# Patient Record
Sex: Male | Born: 1955 | Race: White | Hispanic: No | Marital: Married | Smoking: Former smoker
Health system: Southern US, Community
[De-identification: ages and names within clinical notes are randomized; demographics above are authoritative.]

## PROBLEM LIST (undated history)

## (undated) DIAGNOSIS — T7840XA Allergy, unspecified, initial encounter: Secondary | ICD-10-CM

## (undated) DIAGNOSIS — I10 Essential (primary) hypertension: Secondary | ICD-10-CM

## (undated) DIAGNOSIS — M109 Gout, unspecified: Secondary | ICD-10-CM

## (undated) DIAGNOSIS — E079 Disorder of thyroid, unspecified: Secondary | ICD-10-CM

## (undated) HISTORY — DX: Allergy, unspecified, initial encounter: T78.40XA

## (undated) HISTORY — DX: Essential (primary) hypertension: I10

## (undated) HISTORY — DX: Gout, unspecified: M10.9

## (undated) HISTORY — DX: Disorder of thyroid, unspecified: E07.9

## (undated) HISTORY — PX: KNEE ARTHROSCOPY: SHX127

---

## 1998-11-09 HISTORY — PX: TOTAL HIP ARTHROPLASTY: SHX124

## 2002-11-09 HISTORY — PX: TOTAL HIP ARTHROPLASTY: SHX124

## 2012-11-28 DIAGNOSIS — E559 Vitamin D deficiency, unspecified: Secondary | ICD-10-CM | POA: Insufficient documentation

## 2012-11-28 LAB — LIPID PANEL
Cholesterol: 164 mg/dL (ref 0–200)
HDL: 36 mg/dL (ref 35–70)
LDL Cholesterol: 86 mg/dL
Triglycerides: 207 mg/dL — AB (ref 40–160)

## 2012-11-28 LAB — HEPATIC FUNCTION PANEL
ALT: 43 U/L — AB (ref 10–40)
AST: 25 U/L (ref 14–40)
Alkaline Phosphatase: 39 U/L (ref 25–125)
Bilirubin, Total: 1.5 mg/dL

## 2012-11-28 LAB — CBC AND DIFFERENTIAL
HCT: 46 % (ref 41–53)
Hemoglobin: 16.3 g/dL (ref 13.5–17.5)
Platelets: 273 10*3/uL (ref 150–399)
WBC: 7.9 10^3/mL

## 2012-11-28 LAB — BASIC METABOLIC PANEL
BUN: 19 mg/dL (ref 4–21)
Creatinine: 1.1 mg/dL (ref 0.6–1.3)
Glucose: 122 mg/dL
Potassium: 5 mmol/L (ref 3.4–5.3)
Sodium: 145 mmol/L (ref 137–147)

## 2012-11-28 LAB — HEMOGLOBIN A1C: Hgb A1c MFr Bld: 5.5 % (ref 4.0–6.0)

## 2013-05-26 ENCOUNTER — Ambulatory Visit (INDEPENDENT_AMBULATORY_CARE_PROVIDER_SITE_OTHER): Payer: 59 | Admitting: Family Medicine

## 2013-05-26 ENCOUNTER — Encounter: Payer: Self-pay | Admitting: Family Medicine

## 2013-05-26 VITALS — BP 132/82 | HR 51 | Ht 65.0 in | Wt 175.0 lb

## 2013-05-26 DIAGNOSIS — N200 Calculus of kidney: Secondary | ICD-10-CM

## 2013-05-26 DIAGNOSIS — M1120 Other chondrocalcinosis, unspecified site: Secondary | ICD-10-CM | POA: Insufficient documentation

## 2013-05-26 DIAGNOSIS — M109 Gout, unspecified: Secondary | ICD-10-CM

## 2013-05-26 DIAGNOSIS — H409 Unspecified glaucoma: Secondary | ICD-10-CM

## 2013-05-26 DIAGNOSIS — I10 Essential (primary) hypertension: Secondary | ICD-10-CM

## 2013-05-26 DIAGNOSIS — E559 Vitamin D deficiency, unspecified: Secondary | ICD-10-CM

## 2013-05-26 LAB — LIPID PANEL
Cholesterol: 153 mg/dL (ref 0–200)
HDL: 37 mg/dL — ABNORMAL LOW (ref >39)
LDL Cholesterol: 80 mg/dL (ref 0–99)
Total CHOL/HDL Ratio: 4.1 Ratio
Triglycerides: 179 mg/dL — ABNORMAL HIGH (ref <150)
VLDL: 36 mg/dL (ref 0–40)

## 2013-05-26 LAB — COMPLETE METABOLIC PANEL WITH GFR
ALT: 26 U/L (ref 0–53)
AST: 22 U/L (ref 0–37)
Albumin: 4.7 g/dL (ref 3.5–5.2)
Alkaline Phosphatase: 34 U/L — ABNORMAL LOW (ref 39–117)
BUN: 18 mg/dL (ref 6–23)
CO2: 28 mEq/L (ref 19–32)
Calcium: 10.1 mg/dL (ref 8.4–10.5)
Chloride: 103 mEq/L (ref 96–112)
Creat: 1.2 mg/dL (ref 0.50–1.35)
GFR, Est African American: 77 mL/min
GFR, Est Non African American: 66 mL/min
Glucose, Bld: 124 mg/dL — ABNORMAL HIGH (ref 70–99)
Potassium: 5.3 mEq/L (ref 3.5–5.3)
Sodium: 139 mEq/L (ref 135–145)
Total Bilirubin: 1.6 mg/dL — ABNORMAL HIGH (ref 0.3–1.2)
Total Protein: 7.3 g/dL (ref 6.0–8.3)

## 2013-05-26 LAB — URIC ACID: Uric Acid, Serum: 8.5 mg/dL — ABNORMAL HIGH (ref 4.0–7.8)

## 2013-05-26 MED ORDER — ALLOPURINOL 100 MG PO TABS: 100.0000 mg | ORAL_TABLET | Freq: Every day | ORAL | Status: DC

## 2013-05-26 MED ORDER — SPIRONOLACTONE 25 MG PO TABS: 25.0000 mg | ORAL_TABLET | Freq: Every day | ORAL | Status: DC

## 2013-05-26 MED ORDER — COLCHICINE 0.6 MG PO TABS: 0.6000 mg | ORAL_TABLET | ORAL | Status: DC | PRN

## 2013-05-26 MED ORDER — AMLODIPINE BESYLATE 5 MG PO TABS: 5.0000 mg | ORAL_TABLET | Freq: Every day | ORAL | Status: DC

## 2013-05-26 MED ORDER — ATENOLOL 100 MG PO TABS: 100.0000 mg | ORAL_TABLET | Freq: Every day | ORAL | Status: DC

## 2013-05-26 NOTE — Progress Notes (Signed)
Subjective:    Patient ID: Christopher Gregory, male    DOB: Apr 17, 1956, 57 y.o.   MRN: [id number]  HPI HTN -  Pt denies chest pain, SOB, dizziness, or heart palpitations.  Taking meds as directed w/o problems.  Denies medication side effects. 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    Gout - Has about 2 gout flares a year. Last flare was last weekend. He is not on allopurinol.   Vit D def - now taking 2000IU.  Last level that was drawn was 11/28/2012. Vitamin D. level was 20.  Hyperlipidemia - inc his red yeast rice to BID.    Review of Systems  Constitutional: Negative for fever, diaphoresis and unexpected weight change.  HENT: Negative for hearing loss, rhinorrhea, sneezing and tinnitus.   Eyes: Negative for visual disturbance.  Respiratory: Negative for cough and wheezing.   Cardiovascular: Negative for chest pain and palpitations.  Gastrointestinal: Negative for nausea, vomiting, diarrhea and blood in stool.  Genitourinary: Negative for dysuria and discharge.  Musculoskeletal: Negative for myalgias and arthralgias.  Skin: Negative for rash.  Neurological: Negative for headaches.  Hematological: Negative for adenopathy.  Psychiatric/Behavioral: Negative for sleep disturbance and dysphoric mood. The patient is not nervous/anxious.    BP 132/82  Pulse 51  Ht 5\' 5"  (1.651 m)  Wt 175 lb (79.379 kg)  BMI 29.12 kg/m2    Allergies  Allergen Reactions  . Ace Inhibitors Other (See Comments)    Allergic reaction  . Erythromycin Rash    Past Medical History  Diagnosis Date  . Hypertension   . Gout     Past Surgical History  Procedure Laterality Date  . Total hip arthroplasty  2000  . Total hip arthroplasty  2004  . Knee arthroscopy      History   Social History  . Marital Status: Married    Spouse Name: Dois Davenport    Number of Children: N/A  . Years of Education: N/A   Occupational History  . 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     Social History Main Topics  . Smoking status:  Former Smoker    Types: Cigarettes    Quit date: 11/26/1973  . Smokeless tobacco: Never Used  . Alcohol Use: No  . Drug Use: No  . Sexually Active: Yes -- Male partner(s)   Other Topics Concern  . Not on file   Social History Narrative   No regular exercise.     Family History  Problem Relation Age of Onset  . Hypertension Mother   . Hyperlipidemia Father     Outpatient Encounter Prescriptions as of 05/26/2013  Medication Sig Dispense Refill  . amLODipine (NORVASC) 5 MG tablet Take 1 tablet (5 mg total) by mouth daily.  90 tablet  2  . atenolol (TENORMIN) 100 MG tablet Take 1 tablet (100 mg total) by mouth daily.  90 tablet  2  . Coenzyme Q10-Red Yeast Rice (CO Q-10 PLUS RED YEAST RICE PO) Take 2 capsules by mouth daily.      . colchicine (COLCRYS) 0.6 MG tablet Take 1 tablet (0.6 mg total) by mouth as needed.  90 tablet  2  . latanoprost (XALATAN) 0.005 % ophthalmic solution 1 drop at bedtime.      Marland Kitchen spironolactone (ALDACTONE) 25 MG tablet Take 1 tablet (25 mg total) by mouth daily.  90 tablet  2  . [DISCONTINUED] amLODipine (NORVASC) 5 MG tablet Take 5 mg by mouth daily.      . [  DISCONTINUED] atenolol (TENORMIN) 100 MG tablet Take 100 mg by mouth daily.      . [DISCONTINUED] colchicine (COLCRYS) 0.6 MG tablet Take 0.6 mg by mouth as needed.      . [DISCONTINUED] spironolactone (ALDACTONE) 25 MG tablet Take 25 mg by mouth daily.      Marland Kitchen allopurinol (ZYLOPRIM) 100 MG tablet Take 1 tablet (100 mg total) by mouth daily.  90 tablet  2   No facility-administered encounter medications on file as of 05/26/2013.          Objective:   Physical Exam  Constitutional: He is oriented to person, place, and time. He appears well-developed and well-nourished.  HENT:  Head: Normocephalic and atraumatic.  Cardiovascular: Normal rate, regular rhythm and normal heart sounds.   Pulmonary/Chest: Effort normal and breath sounds normal.  Musculoskeletal: He exhibits no edema.  Neurological:  He is alert and oriented to person, place, and time.  Skin: Skin is warm and dry.  Psychiatric: He has a normal mood and affect. His behavior is normal.          Assessment & Plan:  HTN - Well controlled. Check CMP because he has a normal outcome. Reminded him that he does need to be seen twice a year for his blood pressure does need routine blood work because of his medications.  F/U in 6 months.   Gout - had a long discussion about tx.  Discussed need to start allpurinol. He is really not on anything to modify his disease process.  Him that even though he only has 2 flares per year that the gout is still causing destruction to his joints slowly over time. We discussed the risks and benefits of the medication. We'll start with 1 100 mg. We'll recheck his uric acid in 6 months. Goal is to get uric acid under 6. We can adjust his dose at that time.  Vitamin D deficiency-he is now increased his vitamin D 2 2000 international units daily. Recheck level today.  Hyperlipidemia-he has increased his red yeast rice to 2 tabs daily. He is very adjusted to see what his repeat lipids show.

## 2013-05-27 LAB — VITAMIN D 25 HYDROXY (VIT D DEFICIENCY, FRACTURES): Vit D, 25-Hydroxy: 48 ng/mL (ref 30–89)

## 2013-06-06 ENCOUNTER — Encounter: Payer: Self-pay | Admitting: *Deleted

## 2013-10-28 ENCOUNTER — Encounter: Payer: Self-pay | Admitting: Emergency Medicine

## 2013-10-28 ENCOUNTER — Emergency Department
Admission: EM | Admit: 2013-10-28 | Discharge: 2013-10-28 | Disposition: A | Discharge status: Home / Self Care | Payer: 59 | Source: Home / Self Care | Attending: Family Medicine | Admitting: Family Medicine

## 2013-10-28 DIAGNOSIS — J069 Acute upper respiratory infection, unspecified: Secondary | ICD-10-CM

## 2013-10-28 MED ORDER — AMOXICILLIN 875 MG PO TABS: 875.0000 mg | ORAL_TABLET | Freq: Two times a day (BID) | ORAL | Status: DC

## 2013-10-28 MED ORDER — BENZONATATE 200 MG PO CAPS: 200.0000 mg | ORAL_CAPSULE | Freq: Every day | ORAL | Status: DC

## 2013-10-28 NOTE — ED Provider Notes (Signed)
CSN: [id number]     Arrival date & time 10/28/13  1032 History   First MD Initiated Contact with Patient 10/28/13 1230     Chief Complaint  Patient presents with  . Cough    x 5 days  . Fever    couple of days      HPI Comments: Patient developed a low grade fever to 100.4 about five days ago with nasal congestion, myalgias, chills, and fatigue.  He has had a mild non-productive cough for 4 days.  The history is provided by the patient.    Past Medical History  Diagnosis Date  . Hypertension   . Gout    Past Surgical History  Procedure Laterality Date  . Total hip arthroplasty  2000  . Total hip arthroplasty  2004  . Knee arthroscopy     Family History  Problem Relation Age of Onset  . Hypertension Mother   . Hyperlipidemia Father    History  Substance Use Topics  . Smoking status: Former Smoker    Types: Cigarettes    Quit date: 11/26/1973  . Smokeless tobacco: Never Used  . Alcohol Use: No    Review of Systems No sore throat + cough No pleuritic pain but chest feels tight No wheezing + nasal congestion + post-nasal drainage No sinus pain/pressure No itchy/red eyes No earache No hemoptysis No SOB No fever, + chills No nausea No vomiting No abdominal pain No diarrhea No urinary symptoms No skin rash + fatigue + myalgias No headache Used OTC meds without relief  Allergies  Ace inhibitors and Erythromycin  Home Medications   Current Outpatient Rx  Name  Route  Sig  Dispense  Refill  . allopurinol (ZYLOPRIM) 100 MG tablet   Oral   Take 1 tablet (100 mg total) by mouth daily.   90 tablet   2   . amLODipine (NORVASC) 5 MG tablet   Oral   Take 1 tablet (5 mg total) by mouth daily.   90 tablet   2   . atenolol (TENORMIN) 100 MG tablet   Oral   Take 1 tablet (100 mg total) by mouth daily.   90 tablet   2   . colchicine (COLCRYS) 0.6 MG tablet   Oral   Take 1 tablet (0.6 mg total) by mouth as needed.   90 tablet   2   . latanoprost  (XALATAN) 0.005 % ophthalmic solution      1 drop at bedtime.         Marland Kitchen spironolactone (ALDACTONE) 25 MG tablet   Oral   Take 1 tablet (25 mg total) by mouth daily.   90 tablet   2   . timolol (BETIMOL) 0.25 % ophthalmic solution      1-2 drops 2 (two) times daily.         Marland Kitchen amoxicillin (AMOXIL) 875 MG tablet   Oral   Take 1 tablet (875 mg total) by mouth 2 (two) times daily. (Rx void after 11/05/13)   20 tablet   0   . benzonatate (TESSALON) 200 MG capsule   Oral   Take 1 capsule (200 mg total) by mouth at bedtime. Take as needed for cough   12 capsule   0   . Coenzyme Q10-Red Yeast Rice (CO Q-10 PLUS RED YEAST RICE PO)   Oral   Take 2 capsules by mouth daily.          BP 135/88  Pulse 71  Temp(Src) 99.8  F (37.7 C) (Oral)  Ht 5\' 5"  (1.651 m)  Wt 179 lb (81.194 kg)  BMI 29.79 kg/m2  SpO2 97% Physical Exam Nursing notes and Vital Signs reviewed. Appearance:  Patient appears healthy, stated age, and in no acute distress Eyes:  Pupils are equal, round, and reactive to light and accomodation.  Extraocular movement is intact.  Conjunctivae are not inflamed  Ears:  Canals normal.  Tympanic membranes normal.  Nose:  Mildly congested turbinates.  No sinus tenderness.    Pharynx:  Normal Neck:  Supple.  No adenopathy Lungs:  Clear to auscultation.  Breath sounds are equal.  Heart:  Regular rate and rhythm without murmurs, rubs, or gallops.  Abdomen:  Nontender without masses or hepatosplenomegaly.  Bowel sounds are present.  No CVA or flank tenderness.  Extremities:  No edema.  No calf tenderness Skin:  No rash present.   ED Course  Procedures  none      MDM   1. Acute upper respiratory infections of unspecified site; suspect viral URI    There is no evidence of bacterial infection today.  Treat symptomatically for now  Prescription written for Benzonatate (Tessalon) to take at bedtime for night-time cough.  Take plain Mucinex (1200 mg guaifenesin) twice  daily for cough and congestion.  Increase fluid intake, rest. May use Afrin nasal spray (or generic oxymetazoline) twice daily for about 5 days.  Also recommend using saline nasal spray several times daily and saline nasal irrigation (AYR is a common brand) Stop all antihistamines for now, and other non-prescription cough/cold preparations. Begin Amoxicillin if not improving about 5 days or if persistent fever develops (Given a prescription to hold, with an expiration date)  Follow-up with family doctor if not improving about10 days.     Lattie Haw, MD 10/30/13 [phone]

## 2013-10-28 NOTE — ED Notes (Signed)
Christopher Gregory complains of productive cough with green sputum for 5 days. He also reports fever, chills, runny nose with clear discharge and chest tightness for a couple of days.

## 2013-11-27 ENCOUNTER — Ambulatory Visit (INDEPENDENT_AMBULATORY_CARE_PROVIDER_SITE_OTHER): Payer: 59 | Admitting: Family Medicine

## 2013-11-27 ENCOUNTER — Encounter: Payer: Self-pay | Admitting: Family Medicine

## 2013-11-27 VITALS — BP 128/82 | HR 55 | Temp 98.0°F | Ht 65.0 in | Wt 176.0 lb

## 2013-11-27 DIAGNOSIS — Z125 Encounter for screening for malignant neoplasm of prostate: Secondary | ICD-10-CM

## 2013-11-27 DIAGNOSIS — R7309 Other abnormal glucose: Secondary | ICD-10-CM

## 2013-11-27 DIAGNOSIS — R7301 Impaired fasting glucose: Secondary | ICD-10-CM

## 2013-11-27 DIAGNOSIS — Z0000 Encounter for general adult medical examination without abnormal findings: Secondary | ICD-10-CM

## 2013-11-27 DIAGNOSIS — M109 Gout, unspecified: Secondary | ICD-10-CM

## 2013-11-27 LAB — BASIC METABOLIC PANEL WITH GFR
BUN: 16 mg/dL (ref 6–23)
CO2: 28 mEq/L (ref 19–32)
Calcium: 9.3 mg/dL (ref 8.4–10.5)
Chloride: 103 mEq/L (ref 96–112)
Creat: 1.11 mg/dL (ref 0.50–1.35)
GFR, Est African American: 84 mL/min
GFR, Est Non African American: 72 mL/min
Glucose, Bld: 122 mg/dL — ABNORMAL HIGH (ref 70–99)
Potassium: 4.8 mEq/L (ref 3.5–5.3)
Sodium: 139 mEq/L (ref 135–145)

## 2013-11-27 LAB — URIC ACID: Uric Acid, Serum: 6 mg/dL (ref 4.0–7.8)

## 2013-11-27 LAB — HEMOGLOBIN A1C
Hgb A1c MFr Bld: 5.7 % — ABNORMAL HIGH (ref <5.7)
Mean Plasma Glucose: 117 mg/dL — ABNORMAL HIGH (ref <117)

## 2013-11-27 MED ORDER — ATENOLOL 100 MG PO TABS: 100.0000 mg | ORAL_TABLET | Freq: Every day | ORAL | Status: DC

## 2013-11-27 MED ORDER — AMLODIPINE BESYLATE 5 MG PO TABS: 5.0000 mg | ORAL_TABLET | Freq: Every day | ORAL | Status: DC

## 2013-11-27 MED ORDER — ALLOPURINOL 100 MG PO TABS: 100.0000 mg | ORAL_TABLET | Freq: Every day | ORAL | Status: DC

## 2013-11-27 MED ORDER — COLCHICINE 0.6 MG PO TABS: 0.6000 mg | ORAL_TABLET | ORAL | Status: DC | PRN

## 2013-11-27 MED ORDER — SPIRONOLACTONE 25 MG PO TABS: 25.0000 mg | ORAL_TABLET | Freq: Every day | ORAL | Status: DC

## 2013-11-27 NOTE — Progress Notes (Signed)
Subjective:    Patient ID: Christopher Gregory, male    DOB: [birth year], 58 y.o.   MRN: [id number]  HPI CPE -  Declines flu vaccine. Declines colonoscopy. Thinks his tetanus was 5 years ago. He was previously running 5 miles per day. But backed off because of pain with groin hernia. He does not want to have them surgically repaired right now. He says most days they don't bother him at all and less he significantly increases his activity level.  Hypertension- Pt denies chest pain, SOB, dizziness, or heart palpitations.  Taking meds as directed w/o problems.  Denies medication side effects.    Gout- on allopurinol. Last uric acid level was elevated.  He actually says his hands have felt much better since being on the allopurinol. He did complete 6 months of colcris and allopurinol and now is just taking allopurinol. He has not had any problems or side effects with the medication. He did have some concerns about whether not it is hard on his liver or not.  Abnormal glucose-he did have elevated glucose on his blood work back in August. We're following up on an as well.  Review of Systems Comprehensive review of systems is negative.  BP 143/84  Pulse 55  Temp(Src) 98 F (36.7 C)  Ht 5' 5"  (1.651 m)  Wt 176 lb (79.833 kg)  BMI 29.29 kg/m2  SpO2 97%    Allergies  Allergen Reactions  . Ace Inhibitors Other (See Comments)    Allergic reaction  . Erythromycin Rash    Past Medical History  Diagnosis Date  . Hypertension   . Gout     Past Surgical History  Procedure Laterality Date  . Total hip arthroplasty  2000  . Total hip arthroplasty  2004  . Knee arthroscopy      History   Social History  . Marital Status: Married    Spouse Name: Katharine Look    Number of Children: N/A  . Years of Education: N/A   Occupational History  . 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, Truck driver  .     Social History Main Topics  . Smoking status: Former Smoker    Types: Cigarettes    Quit date: 11/26/1973  .  Smokeless tobacco: Never Used  . Alcohol Use: No  . Drug Use: No  . Sexual Activity: Yes    Partners: Female   Other Topics Concern  . Not on file   Social History Narrative   No regular exercise.     Family History  Problem Relation Age of Onset  . Hypertension Mother   . Hyperlipidemia Father     Outpatient Encounter Prescriptions as of 11/27/2013  Medication Sig  . allopurinol (ZYLOPRIM) 100 MG tablet Take 1 tablet (100 mg total) by mouth daily.  Marland Kitchen amLODipine (NORVASC) 5 MG tablet Take 1 tablet (5 mg total) by mouth daily.  Marland Kitchen atenolol (TENORMIN) 100 MG tablet Take 1 tablet (100 mg total) by mouth daily.  . Coenzyme Q10-Red Yeast Rice (CO Q-10 PLUS RED YEAST RICE PO) Take 2 capsules by mouth daily.  . colchicine (COLCRYS) 0.6 MG tablet Take 1 tablet (0.6 mg total) by mouth as needed.  . latanoprost (XALATAN) 0.005 % ophthalmic solution 1 drop at bedtime.  Marland Kitchen spironolactone (ALDACTONE) 25 MG tablet Take 1 tablet (25 mg total) by mouth daily.  . timolol (BETIMOL) 0.25 % ophthalmic solution 1-2 drops 2 (two) times daily.  . [DISCONTINUED] allopurinol (ZYLOPRIM) 100 MG tablet Take 1  tablet (100 mg total) by mouth daily.  . [DISCONTINUED] amLODipine (NORVASC) 5 MG tablet Take 1 tablet (5 mg total) by mouth daily.  . [DISCONTINUED] atenolol (TENORMIN) 100 MG tablet Take 1 tablet (100 mg total) by mouth daily.  . [DISCONTINUED] colchicine (COLCRYS) 0.6 MG tablet Take 1 tablet (0.6 mg total) by mouth as needed.  . [DISCONTINUED] spironolactone (ALDACTONE) 25 MG tablet Take 1 tablet (25 mg total) by mouth daily.  . [DISCONTINUED] amoxicillin (AMOXIL) 875 MG tablet Take 1 tablet (875 mg total) by mouth 2 (two) times daily. (Rx void after 11/05/13)  . [DISCONTINUED] benzonatate (TESSALON) 200 MG capsule Take 1 capsule (200 mg total) by mouth at bedtime. Take as needed for cough          Objective:   Physical Exam  Constitutional: He is oriented to person, place, and time. He appears  well-developed and well-nourished.  HENT:  Head: Normocephalic and atraumatic.  Right Ear: External ear normal.  Left Ear: External ear normal.  Nose: Nose normal.  Mouth/Throat: Oropharynx is clear and moist.  Eyes: Conjunctivae and EOM are normal. Pupils are equal, round, and reactive to light.  Neck: Normal range of motion. Neck supple. No thyromegaly present.  Cardiovascular: Normal rate, regular rhythm, normal heart sounds and intact distal pulses.   Pulmonary/Chest: Effort normal and breath sounds normal.  Abdominal: Soft. Bowel sounds are normal. He exhibits no distension and no mass. There is no tenderness. There is no rebound and no guarding.  Musculoskeletal: Normal range of motion.  Lymphadenopathy:    He has no cervical adenopathy.  Neurological: He is alert and oriented to person, place, and time. He has normal reflexes.  Skin: Skin is warm and dry.  Psychiatric: He has a normal mood and affect. His behavior is normal. Judgment and thought content normal.          Assessment & Plan:   CPE - Keep up a regular exercise program and make sure you are eating a healthy diet Try to eat 4 servings of dairy a day, or if you are lactose intolerant take a calcium with vitamin D daily.  Your vaccines are up to date.  Declined colonoscopy. He says he has a friend at church use: Was perforated with the procedure and does not want to have it done. He is willing to do stool cards. Said given today. He can drop him off at his convenience.  HTN-   Gout - Due for repeat uric acid.    Abnormal glucose-we'll repeat hemoglobin A1c today.

## 2013-11-28 LAB — PSA: PSA: 1.41 ng/mL (ref <=4.00)

## 2013-12-05 ENCOUNTER — Other Ambulatory Visit: Payer: Self-pay | Admitting: *Deleted

## 2013-12-05 DIAGNOSIS — Z1211 Encounter for screening for malignant neoplasm of colon: Secondary | ICD-10-CM

## 2013-12-05 LAB — POC HEMOCCULT BLD/STL (HOME/3-CARD/SCREEN)
Card #2 Fecal Occult Blod, POC: NEGATIVE
Card #3 Fecal Occult Blood, POC: NEGATIVE
Fecal Occult Blood, POC: NEGATIVE

## 2014-04-26 ENCOUNTER — Ambulatory Visit (INDEPENDENT_AMBULATORY_CARE_PROVIDER_SITE_OTHER): Payer: 59 | Admitting: Sports Medicine

## 2014-04-26 ENCOUNTER — Encounter: Payer: Self-pay | Admitting: Sports Medicine

## 2014-04-26 ENCOUNTER — Ambulatory Visit (INDEPENDENT_AMBULATORY_CARE_PROVIDER_SITE_OTHER): Payer: 59

## 2014-04-26 ENCOUNTER — Telehealth: Payer: Self-pay | Admitting: *Deleted

## 2014-04-26 VITALS — BP 134/84 | HR 53 | Ht 65.0 in | Wt 175.0 lb

## 2014-04-26 DIAGNOSIS — M5416 Radiculopathy, lumbar region: Secondary | ICD-10-CM | POA: Insufficient documentation

## 2014-04-26 DIAGNOSIS — IMO0002 Reserved for concepts with insufficient information to code with codable children: Secondary | ICD-10-CM

## 2014-04-26 DIAGNOSIS — M5137 Other intervertebral disc degeneration, lumbosacral region: Secondary | ICD-10-CM

## 2014-04-26 MED ORDER — PREDNISONE 50 MG PO TABS: ORAL_TABLET | ORAL | Status: DC

## 2014-04-26 MED ORDER — MELOXICAM 15 MG PO TABS: ORAL_TABLET | ORAL | Status: DC

## 2014-04-26 MED ORDER — CYCLOBENZAPRINE HCL 10 MG PO TABS: ORAL_TABLET | ORAL | Status: DC

## 2014-04-26 NOTE — Progress Notes (Signed)
   Subjective:    I'm seeing this patient as a consultation for:   Dr. Linford ArnoldMetheney  CC: Low back pain  HPI: For the past 2 months this pleasant 58 year old male has had low back pain worse with sitting and flexion, radiating down the right leg in an L5 versus an S1 distribution. Pain is moderate, persistent, he has seen a chiropractor, and has also been through at least 6 weeks of chiropractic care without any improvement.  Past medical history, Surgical history, Family history not pertinant except as noted below, Social history, Allergies, and medications have been entered into the medical record, reviewed, and no changes needed.   Review of Systems: No headache, visual changes, nausea, vomiting, diarrhea, constipation, dizziness, abdominal pain, skin rash, fevers, chills, night sweats, weight loss, swollen lymph nodes, body aches, joint swelling, muscle aches, chest pain, shortness of breath, mood changes, visual or auditory hallucinations.   Objective:   General: Well Developed, well nourished, and in no acute distress.  Neuro/Psych: Alert and oriented x3, extra-ocular muscles intact, able to move all 4 extremities, sensation grossly intact. Skin: Warm and dry, no rashes noted.  Respiratory: Not using accessory muscles, speaking in full sentences, trachea midline.  Cardiovascular: Pulses palpable, no extremity edema. Abdomen: Does not appear distended. Back Exam:  Inspection: Unremarkable  Motion: Flexion 45 deg, Extension 45 deg, Side Bending to 45 deg bilaterally,  Rotation to 45 deg bilaterally  SLR laying: Negative  XSLR laying: Negative  Palpable tenderness: None. FABER: negative. Sensory change: Gross sensation intact to all lumbar and sacral dermatomes.  Reflexes: 2+ at both patellar tendons, 2+ at achilles tendons, Babinski's downgoing.  Strength at foot  Plantar-flexion: 5/5 Dorsi-flexion: 5/5 Eversion: 5/5 Inversion: 5/5  Leg strength  Quad: 5/5 Hamstring: 5/5 Hip flexor:  5/5 Hip abductors: 5/5  Gait unremarkable.  Lumbar spine x-rays show L5-S1 degenerative disc disease.  Impression and Recommendations:   This case required medical decision making of moderate complexity.

## 2014-04-26 NOTE — Assessment & Plan Note (Signed)
Has already failed chiropractic care, and 6 weeks of conservative care. No red flags. Likely left sided L5 versus S1. Prednisone, Mobic, Flexeril, x-rays, at this point we are going to also obtain an MRI. The like to see him back for custom orthotics, and then for MRI results.

## 2014-04-26 NOTE — Telephone Encounter (Signed)
PA obtained MRI Lumbar Spine w/o contrast. Auth# [id number].  Meyer CoryMisty Jaidan Stachnik, LPN

## 2014-04-28 ENCOUNTER — Ambulatory Visit (HOSPITAL_BASED_OUTPATIENT_CLINIC_OR_DEPARTMENT_OTHER)
Admission: RE | Admit: 2014-04-28 | Discharge: 2014-04-28 | Disposition: A | Discharge status: Home / Self Care | Payer: 59 | Source: Ambulatory Visit | Attending: Sports Medicine | Admitting: Sports Medicine

## 2014-04-28 DIAGNOSIS — M48061 Spinal stenosis, lumbar region without neurogenic claudication: Secondary | ICD-10-CM | POA: Insufficient documentation

## 2014-04-28 DIAGNOSIS — M5416 Radiculopathy, lumbar region: Secondary | ICD-10-CM

## 2014-04-28 DIAGNOSIS — IMO0002 Reserved for concepts with insufficient information to code with codable children: Secondary | ICD-10-CM | POA: Insufficient documentation

## 2014-04-30 ENCOUNTER — Telehealth: Payer: Self-pay | Admitting: *Deleted

## 2014-04-30 MED ORDER — TRAMADOL-ACETAMINOPHEN 37.5-325 MG PO TABS: 1.0000 | ORAL_TABLET | Freq: Three times a day (TID) | ORAL | Status: DC | PRN

## 2014-04-30 NOTE — Telephone Encounter (Signed)
I'm happy to see him whenever he would like to go over the MRI results, and we can order his epidural at that time.

## 2014-04-30 NOTE — Telephone Encounter (Signed)
Prescription for Ultracet is in my box.

## 2014-04-30 NOTE — Telephone Encounter (Signed)
Wife notified and script faxed to pharmacy. Corliss SkainsJamie Miciah Shealy, CMA

## 2014-04-30 NOTE — Telephone Encounter (Signed)
Wife called this morning that Christopher Gregory was still having a lot of pain. She said that he has finished the prednisone and the pain meds are not giving him any relief. They are icing approx 20 minutes intermittently and felt this was also not helping. She said that they have not tried heat on area. She said that he has used expired hydrocodone in place of pain meds prescribed last week. Christopher Gregory had his MRI on Saturday. Please advise.

## 2014-04-30 NOTE — Telephone Encounter (Signed)
Pt has a scheduled f/u appt this Thursday but can he have additional pain med to get him thru until that time. Wife states that he has finished the prednisone but the mobic and flexeril are not helping at all for him to deal with the pain.

## 2014-05-01 ENCOUNTER — Encounter: Payer: Self-pay | Admitting: Sports Medicine

## 2014-05-01 ENCOUNTER — Ambulatory Visit
Admission: RE | Admit: 2014-05-01 | Discharge: 2014-05-01 | Disposition: A | Discharge status: Home / Self Care | Payer: 59 | Source: Ambulatory Visit | Attending: Sports Medicine | Admitting: Sports Medicine

## 2014-05-01 ENCOUNTER — Ambulatory Visit (INDEPENDENT_AMBULATORY_CARE_PROVIDER_SITE_OTHER): Payer: 59 | Admitting: Sports Medicine

## 2014-05-01 VITALS — BP 129/82 | HR 86 | Ht 65.0 in | Wt 182.0 lb

## 2014-05-01 DIAGNOSIS — M5416 Radiculopathy, lumbar region: Secondary | ICD-10-CM

## 2014-05-01 DIAGNOSIS — IMO0002 Reserved for concepts with insufficient information to code with codable children: Secondary | ICD-10-CM

## 2014-05-01 MED ORDER — IOHEXOL 180 MG/ML  SOLN
1.0000 mL | Freq: Once | INTRAMUSCULAR | Status: AC | PRN
  Administered 2014-05-01: 1 mL via EPIDURAL

## 2014-05-01 MED ORDER — METHYLPREDNISOLONE ACETATE 40 MG/ML INJ SUSP (RADIOLOG
120.0000 mg | Freq: Once | INTRAMUSCULAR | Status: AC
  Administered 2014-05-01: 120 mg via EPIDURAL

## 2014-05-01 NOTE — Progress Notes (Signed)
  Subjective:    CC: MRI results  HPI: Christopher LericheMark returns, he continues to have low back pain worse with flexion, Valsalva, radiating down the right leg. Pain is moderate, persistent. No bowel or bladder dysfunction, or constitutional symptoms.  Past medical history, Surgical history, Family history not pertinant except as noted below, Social history, Allergies, and medications have been entered into the medical record, reviewed, and no changes needed.   Review of Systems: No fevers, chills, night sweats, weight loss, chest pain, or shortness of breath.   Objective:    General: Well Developed, well nourished, and in no acute distress.  Neuro: Alert and oriented x3, extra-ocular muscles intact, sensation grossly intact.  HEENT: Normocephalic, atraumatic, pupils equal round reactive to light, neck supple, no masses, no lymphadenopathy, thyroid nonpalpable.  Skin: Warm and dry, no rashes. Cardiac: Regular rate and rhythm, no murmurs rubs or gallops, no lower extremity edema.  Respiratory: Clear to auscultation bilaterally. Not using accessory muscles, speaking in full sentences.  MRI shows multilevel degenerative disc disease worse at the L5-S1 level with bilateral foraminal stenosis with a broad-based protrusion, there is also bilateral, right worse than left facet arthritis at the L5-S1 level.  Impression and Recommendations:

## 2014-05-01 NOTE — Discharge Instructions (Signed)

## 2014-05-01 NOTE — Assessment & Plan Note (Signed)
Symptoms are predominantly left-sided and discogenic. On review of the MRI he does have a broad-based L5-S1 disc protrusion causing bilateral foraminal stenosis. There is also L5-S1 facet arthrosis worse on the right side but also present on the left. At this point I do think we should proceed with a sided L5-S1 interlaminar epidural injection. To see him back about 2 weeks after the injection to evaluate his response, and if an insufficient response we can certainly target the facet joint.

## 2014-05-03 ENCOUNTER — Encounter: Payer: 59 | Admitting: Sports Medicine

## 2014-05-16 ENCOUNTER — Ambulatory Visit (INDEPENDENT_AMBULATORY_CARE_PROVIDER_SITE_OTHER): Payer: 59 | Admitting: Sports Medicine

## 2014-05-16 ENCOUNTER — Encounter: Payer: Self-pay | Admitting: Sports Medicine

## 2014-05-16 VITALS — BP 147/93 | HR 59 | Ht 65.0 in | Wt 172.0 lb

## 2014-05-16 DIAGNOSIS — IMO0002 Reserved for concepts with insufficient information to code with codable children: Secondary | ICD-10-CM

## 2014-05-16 DIAGNOSIS — M5416 Radiculopathy, lumbar region: Secondary | ICD-10-CM

## 2014-05-16 NOTE — Assessment & Plan Note (Signed)
Custom orthotics as above. Excellent response to S1 transforaminal epidural. At this point we're going to proceed with a bilateral L5-S1 facet injection as he does continue to have some axial symptoms. Return in a month.

## 2014-05-16 NOTE — Progress Notes (Signed)
    Patient was fitted for a : standard, cushioned, semi-rigid orthotic. The orthotic was heated and afterward the patient stood on the orthotic blank positioned on the orthotic stand. The patient was positioned in subtalar neutral position and 10 degrees of ankle dorsiflexion in a weight bearing stance. After completion of molding, a stable base was applied to the orthotic blank. The blank was ground to a stable position for weight bearing. Size: 9 Base: Blue EVA Additional Posting and Padding: None The patient ambulated these, and they were very comfortable.  I spent 40 minutes with this patient, greater than 50% was face-to-face time counseling regarding the below diagnosis.   

## 2014-05-25 ENCOUNTER — Ambulatory Visit: Payer: 59 | Admitting: Family Medicine

## 2014-05-25 ENCOUNTER — Other Ambulatory Visit: Payer: 59

## 2014-06-13 ENCOUNTER — Ambulatory Visit: Payer: 59 | Admitting: Sports Medicine

## 2014-08-20 ENCOUNTER — Other Ambulatory Visit: Payer: 59

## 2014-08-23 ENCOUNTER — Telehealth: Payer: Self-pay

## 2014-08-23 NOTE — Telephone Encounter (Signed)
Have him come see me tomorrow at some point with the orthotics, and I can place a metatarsal pad.  I need his foot here too to help with placement of the pad.

## 2014-08-23 NOTE — Telephone Encounter (Signed)
Kayon's wife, Dois DavenportSandra, called: Loraine LericheMark needs the pressure point pad attached to his orthotics. Can she bring the orthotics by for Dr Benjamin Stainhekkekandam to place or does Loraine LericheMark need to be here for the placement? Please advise.

## 2014-08-23 NOTE — Telephone Encounter (Signed)
Patient will not be available until November 11 th. I scheduled patient.

## 2014-09-19 ENCOUNTER — Other Ambulatory Visit: Payer: 59

## 2014-09-19 ENCOUNTER — Ambulatory Visit (INDEPENDENT_AMBULATORY_CARE_PROVIDER_SITE_OTHER): Payer: 59 | Admitting: Sports Medicine

## 2014-09-19 ENCOUNTER — Encounter: Payer: Self-pay | Admitting: Sports Medicine

## 2014-09-19 VITALS — BP 128/79 | HR 54 | Ht 65.0 in | Wt 176.0 lb

## 2014-09-19 DIAGNOSIS — M5416 Radiculopathy, lumbar region: Secondary | ICD-10-CM

## 2014-09-19 NOTE — Assessment & Plan Note (Signed)
Doing well with custom orthotics and post epidural injection. Not need facet injections. Next line metatarsal pad placed in left orthotic. He may need a hammertoe protector.

## 2014-09-19 NOTE — Progress Notes (Signed)
  Subjective:    CC: follow-up  HPI: Lumbar radiculitis: Resolved after epidural injection.  Metatarsalgia: Overall doing extremely well with custom orthotics, he does need a metatarsal pad.  Past medical history, Surgical history, Family history not pertinant except as noted below, Social history, Allergies, and medications have been entered into the medical record, reviewed, and no changes needed.   Review of Systems: No fevers, chills, night sweats, weight loss, chest pain, or shortness of breath.   Objective:    General: Well Developed, well nourished, and in no acute distress.  Neuro: Alert and oriented x3, extra-ocular muscles intact, sensation grossly intact.  HEENT: Normocephalic, atraumatic, pupils equal round reactive to light, neck supple, no masses, no lymphadenopathy, thyroid nonpalpable.  Skin: Warm and dry, no rashes. Cardiac: Regular rate and rhythm, no murmurs rubs or gallops, no lower extremity edema.  Respiratory: Clear to auscultation bilaterally. Not using accessory muscles, speaking in full sentences.  Metatarsal pad placed in the left orthotic, ambulated and was comfortable.  Impression and Recommendations:

## 2014-09-19 NOTE — Patient Instructions (Signed)
If metatarsal pad does not provide sufficient relief,buy a hammer toe protector over-the-counter.

## 2014-11-12 ENCOUNTER — Other Ambulatory Visit: Payer: Self-pay | Admitting: Family Medicine

## 2014-11-16 ENCOUNTER — Other Ambulatory Visit: Payer: Self-pay | Admitting: Family Medicine

## 2014-11-19 ENCOUNTER — Other Ambulatory Visit: Payer: Self-pay | Admitting: Family Medicine

## 2014-11-26 ENCOUNTER — Ambulatory Visit (INDEPENDENT_AMBULATORY_CARE_PROVIDER_SITE_OTHER): Payer: 59 | Admitting: Family Medicine

## 2014-11-26 ENCOUNTER — Encounter: Payer: Self-pay | Admitting: Family Medicine

## 2014-11-26 VITALS — BP 122/79 | HR 52 | Ht 65.0 in | Wt 174.0 lb

## 2014-11-26 DIAGNOSIS — Z0000 Encounter for general adult medical examination without abnormal findings: Secondary | ICD-10-CM

## 2014-11-26 DIAGNOSIS — R7301 Impaired fasting glucose: Secondary | ICD-10-CM

## 2014-11-26 LAB — COMPLETE METABOLIC PANEL WITH GFR
ALT: 30 U/L (ref 0–53)
AST: 23 U/L (ref 0–37)
Albumin: 4.2 g/dL (ref 3.5–5.2)
Alkaline Phosphatase: 35 U/L — ABNORMAL LOW (ref 39–117)
BUN: 17 mg/dL (ref 6–23)
CO2: 28 mEq/L (ref 19–32)
Calcium: 9.5 mg/dL (ref 8.4–10.5)
Chloride: 101 mEq/L (ref 96–112)
Creat: 1.09 mg/dL (ref 0.50–1.35)
GFR, Est African American: 85 mL/min
GFR, Est Non African American: 73 mL/min
Glucose, Bld: 136 mg/dL — ABNORMAL HIGH (ref 70–99)
Potassium: 4.8 mEq/L (ref 3.5–5.3)
Sodium: 136 mEq/L (ref 135–145)
Total Bilirubin: 1.5 mg/dL — ABNORMAL HIGH (ref 0.2–1.2)
Total Protein: 6.8 g/dL (ref 6.0–8.3)

## 2014-11-26 LAB — LIPID PANEL
Cholesterol: 155 mg/dL (ref 0–200)
HDL: 37 mg/dL — ABNORMAL LOW (ref >39)
LDL Cholesterol: 80 mg/dL (ref 0–99)
Total CHOL/HDL Ratio: 4.2 Ratio
Triglycerides: 189 mg/dL — ABNORMAL HIGH (ref <150)
VLDL: 38 mg/dL (ref 0–40)

## 2014-11-26 LAB — URIC ACID: Uric Acid, Serum: 5.9 mg/dL (ref 4.0–7.8)

## 2014-11-26 LAB — POCT GLYCOSYLATED HEMOGLOBIN (HGB A1C): Hemoglobin A1C: 5.5

## 2014-11-26 MED ORDER — AMLODIPINE BESYLATE 5 MG PO TABS: ORAL_TABLET | ORAL | Status: DC

## 2014-11-26 MED ORDER — COLCHICINE 0.6 MG PO TABS: 0.6000 mg | ORAL_TABLET | ORAL | Status: DC | PRN

## 2014-11-26 MED ORDER — SPIRONOLACTONE 25 MG PO TABS: 25.0000 mg | ORAL_TABLET | Freq: Every day | ORAL | Status: DC

## 2014-11-26 NOTE — Patient Instructions (Signed)
Keep up a regular exercise program and make sure you are eating a healthy diet Try to eat 4 servings of dairy a day, or if you are lactose intolerant take a calcium with vitamin D daily.  Your vaccines are up to date.   

## 2014-11-26 NOTE — Addendum Note (Signed)
Addended by: Deno EtienneBARKLEY, Nadalee Neiswender L on: 11/26/2014 09:13 AM   Modules accepted: Orders, Medications

## 2014-11-26 NOTE — Progress Notes (Signed)
   Subjective:    Patient ID: Christopher Gregory, male    DOB: 02/19/1956, 59 y.o.   MRN: [id number]  HPI Here for CPE today. Due colonoscopy.  She has glaucoma in her left eye and they are considering surgery.  Declined flu shot.    Hypertension- Pt denies chest pain, SOB, dizziness, or heart palpitations.  Taking meds as directed w/o problems.  Denies medication side effects.    Review of Systems     Objective:   Physical Exam  Constitutional: He is oriented to person, place, and time. He appears well-developed and well-nourished.  HENT:  Head: Normocephalic and atraumatic.  Right Ear: External ear normal.  Left Ear: External ear normal.  Nose: Nose normal.  Mouth/Throat: Oropharynx is clear and moist.  TMs and canals are clear.   Eyes: Conjunctivae and EOM are normal. Pupils are equal, round, and reactive to light.  Neck: Neck supple. No thyromegaly present.  Cardiovascular: Normal rate, regular rhythm and normal heart sounds.   Pulmonary/Chest: Effort normal and breath sounds normal.  Lymphadenopathy:    He has no cervical adenopathy.  Neurological: He is alert and oriented to person, place, and time.  Skin: Skin is warm and dry.  Psychiatric: He has a normal mood and affect. His behavior is normal.          Assessment & Plan:  Wellness Exam  -  Keep up a regular exercise program and make sure you are eating a healthy diet Try to eat 4 servings of dairy a day, or if you are lactose intolerant take a calcium with vitamin D daily.  Your vaccines are up to date.   Will do stool cards this year.  Declined screening colonoscopy.   Due screening labs work.   HTN - well controlled. Continue current regimen. F/U in 6 months.    Gout - due to check uric acid.

## 2014-11-27 ENCOUNTER — Encounter: Payer: Self-pay | Admitting: *Deleted

## 2014-11-27 LAB — HIV ANTIBODY (ROUTINE TESTING W REFLEX): HIV 1&2 Ab, 4th Generation: NONREACTIVE

## 2014-11-27 LAB — PSA: PSA: 1.62 ng/mL (ref <=4.00)

## 2014-12-06 ENCOUNTER — Other Ambulatory Visit (INDEPENDENT_AMBULATORY_CARE_PROVIDER_SITE_OTHER): Payer: 59 | Admitting: Family Medicine

## 2014-12-06 DIAGNOSIS — Z1211 Encounter for screening for malignant neoplasm of colon: Secondary | ICD-10-CM

## 2014-12-06 LAB — POC HEMOCCULT BLD/STL (HOME/3-CARD/SCREEN)
Card #2 Fecal Occult Blod, POC: NEGATIVE
Card #3 Fecal Occult Blood, POC: NEGATIVE
Fecal Occult Blood, POC: NEGATIVE

## 2015-02-12 ENCOUNTER — Other Ambulatory Visit: Payer: Self-pay | Admitting: Family Medicine

## 2015-05-03 ENCOUNTER — Encounter: Payer: Self-pay | Admitting: Family Medicine

## 2015-05-03 ENCOUNTER — Ambulatory Visit (INDEPENDENT_AMBULATORY_CARE_PROVIDER_SITE_OTHER): Payer: 59 | Admitting: Family Medicine

## 2015-05-03 VITALS — BP 121/79 | HR 55 | Ht 65.0 in | Wt 169.0 lb

## 2015-05-03 DIAGNOSIS — I10 Essential (primary) hypertension: Secondary | ICD-10-CM | POA: Diagnosis not present

## 2015-05-03 DIAGNOSIS — R001 Bradycardia, unspecified: Secondary | ICD-10-CM | POA: Diagnosis not present

## 2015-05-03 DIAGNOSIS — M898X8 Other specified disorders of bone, other site: Secondary | ICD-10-CM

## 2015-05-03 DIAGNOSIS — M89319 Hypertrophy of bone, unspecified shoulder: Secondary | ICD-10-CM

## 2015-05-03 MED ORDER — ALLOPURINOL 100 MG PO TABS: ORAL_TABLET | ORAL | Status: DC

## 2015-05-03 MED ORDER — ATENOLOL 100 MG PO TABS: ORAL_TABLET | ORAL | Status: DC

## 2015-05-03 MED ORDER — SPIRONOLACTONE 25 MG PO TABS
25.0000 mg | ORAL_TABLET | Freq: Every day | ORAL | Status: DC
Start: 2015-05-03 — End: 2015-11-25

## 2015-05-03 MED ORDER — AMLODIPINE BESYLATE 5 MG PO TABS: ORAL_TABLET | ORAL | Status: DC

## 2015-05-03 NOTE — Patient Instructions (Signed)
Cut the atenolol in half and see if able to get heart rate higher during exercise. Check pressures at the gym.

## 2015-05-03 NOTE — Progress Notes (Signed)
   Subjective:    Patient ID: Christopher Gregory, male    DOB: [birth year], 59 y.o.   MRN: [id number]  HPI Hypertension- Pt denies chest pain, SOB, dizziness, or heart palpitations.  Taking meds as directed w/o problems.  Denies medication side effects.  He is taking atenolol, amlodipine and spironolactone. He has been going to the gym and has been swimming for exercise and playing raquetball. He is concerned    About the low heart rate during exercise.  He feels like it is affecting his ability to lose weight.    He also has a bony prominence over the medial and of his right clavicle. He says it's been there for years and years but feels like it's become more prominent in the last couple years. Occasionally gets some pain in the muscles in his neck a most like a spasm. Occasionally feels a little bit of soreness. No redness etc. No specific trauma or injury or fracture to that bone that he remembers.  Review of Systems     Objective:   Physical Exam  Constitutional: He is oriented to person, place, and time. He appears well-developed and well-nourished.  HENT:  Head: Normocephalic and atraumatic.  Cardiovascular: Normal rate, regular rhythm and normal heart sounds.   Pulmonary/Chest: Effort normal and breath sounds normal.  Prominence of the proximal end of the right clavicle. Feels firm like bone. No cyst or nodules along the border.  Neck with NROM  Neurological: He is alert and oriented to person, place, and time.  Skin: Skin is warm and dry.  Psychiatric: He has a normal mood and affect. His behavior is normal.          Assessment & Plan:  HTN- well controlled. Continue current regimen. Due for BMP. F/U in 6 mo.   Bradycardia - cut metoprolol in half and monitor BP for 2 weks at ago gym. If BP well controlled, then can stick with half until I see him back in 6 months.  Will see if improves his exercise tolerance.   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 He says he'll get the x-ray when he comes back for his physical.

## 2015-05-04 LAB — BASIC METABOLIC PANEL
BUN: 16 mg/dL (ref 6–23)
CO2: 26 mEq/L (ref 19–32)
Calcium: 9.7 mg/dL (ref 8.4–10.5)
Chloride: 101 mEq/L (ref 96–112)
Creat: 1.04 mg/dL (ref 0.50–1.35)
Glucose, Bld: 115 mg/dL — ABNORMAL HIGH (ref 70–99)
Potassium: 4.6 mEq/L (ref 3.5–5.3)
Sodium: 139 mEq/L (ref 135–145)

## 2015-05-24 ENCOUNTER — Telehealth: Payer: Self-pay

## 2015-05-24 NOTE — Telephone Encounter (Signed)
Wife called and states she has noticed Christopher Gregory's skin is very thin now. If he bumps his skin it will tear and bleed a large amount. She is asking if this something he needs to be seen for?

## 2015-05-24 NOTE — Telephone Encounter (Signed)
This certainly not urgent. Conditionally take a look at his next office visit. This is very typical as the skin ages. Becomes more thin and easily tears and bruises.

## 2015-05-27 NOTE — Telephone Encounter (Signed)
Wife advised. 

## 2015-11-25 ENCOUNTER — Encounter: Payer: Self-pay | Admitting: Family Medicine

## 2015-11-25 ENCOUNTER — Ambulatory Visit (INDEPENDENT_AMBULATORY_CARE_PROVIDER_SITE_OTHER): Payer: 59 | Admitting: Family Medicine

## 2015-11-25 VITALS — BP 130/85 | HR 64 | Wt 169.0 lb

## 2015-11-25 DIAGNOSIS — Z0000 Encounter for general adult medical examination without abnormal findings: Secondary | ICD-10-CM

## 2015-11-25 DIAGNOSIS — Z125 Encounter for screening for malignant neoplasm of prostate: Secondary | ICD-10-CM | POA: Diagnosis not present

## 2015-11-25 DIAGNOSIS — I10 Essential (primary) hypertension: Secondary | ICD-10-CM

## 2015-11-25 DIAGNOSIS — Z1159 Encounter for screening for other viral diseases: Secondary | ICD-10-CM | POA: Diagnosis not present

## 2015-11-25 LAB — COMPLETE METABOLIC PANEL WITH GFR
ALT: 16 U/L (ref 9–46)
AST: 16 U/L (ref 10–35)
Albumin: 4.3 g/dL (ref 3.6–5.1)
Alkaline Phosphatase: 35 U/L — ABNORMAL LOW (ref 40–115)
BUN: 16 mg/dL (ref 7–25)
CO2: 27 mmol/L (ref 20–31)
Calcium: 9.3 mg/dL (ref 8.6–10.3)
Chloride: 105 mmol/L (ref 98–110)
Creat: 0.99 mg/dL (ref 0.70–1.25)
GFR, Est African American: >89 mL/min (ref >=60)
GFR, Est Non African American: 82 mL/min (ref >=60)
Glucose, Bld: 131 mg/dL — ABNORMAL HIGH (ref 65–99)
Potassium: 5.2 mmol/L (ref 3.5–5.3)
Sodium: 137 mmol/L (ref 135–146)
Total Bilirubin: 1.2 mg/dL (ref 0.2–1.2)
Total Protein: 6.5 g/dL (ref 6.1–8.1)

## 2015-11-25 LAB — LIPID PANEL
Cholesterol: 143 mg/dL (ref 125–200)
HDL: 38 mg/dL — ABNORMAL LOW (ref >=40)
LDL Cholesterol: 77 mg/dL (ref <130)
Total CHOL/HDL Ratio: 3.8 Ratio (ref <=5.0)
Triglycerides: 142 mg/dL (ref <150)
VLDL: 28 mg/dL (ref <30)

## 2015-11-25 MED ORDER — ATENOLOL 50 MG PO TABS: 50.0000 mg | ORAL_TABLET | Freq: Every day | ORAL | Status: DC

## 2015-11-25 MED ORDER — SPIRONOLACTONE 25 MG PO TABS: 25.0000 mg | ORAL_TABLET | Freq: Every day | ORAL | Status: DC

## 2015-11-25 MED ORDER — ALLOPURINOL 100 MG PO TABS: ORAL_TABLET | ORAL | Status: DC

## 2015-11-25 MED ORDER — AMLODIPINE BESYLATE 5 MG PO TABS: ORAL_TABLET | ORAL | Status: DC

## 2015-11-25 NOTE — Progress Notes (Signed)
Subjective:    Patient ID: Christopher Gregory, male    DOB: 1956-02-28, 60 y.o.   MRN: [id number]  HPI  Here for CPE.  He denies any specific problems or complaints. He reports he is otherwise doing well. He does stay active. He's actually been trying to lose weight and says he has lost about 10 pounds over the last year.    Review of Systems  Hypertensive review of systems is negative.  BP 130/85 mmHg  Pulse 64  Wt 169 lb (76.658 kg)  SpO2 100%    Allergies  Allergen Reactions  . Ace Inhibitors Other (See Comments)    Allergic reaction  . Amlodipine     Swelling  . Neomycin     Hives  . Erythromycin Rash    Past Medical History  Diagnosis Date  . Hypertension   . Gout     Past Surgical History  Procedure Laterality Date  . Total hip arthroplasty  2000  . Total hip arthroplasty  2004  . Knee arthroscopy      Social History   Social History  . Marital Status: Married    Spouse Name: Katharine Look  . Number of Children: N/A  . Years of Education: N/A   Occupational History  . [employer], Truck driver  .     Social History Main Topics  . Smoking status: Former Smoker    Types: Cigarettes    Quit date: 11/26/1973  . Smokeless tobacco: Never Used  . Alcohol Use: No  . Drug Use: No  . Sexual Activity:    Partners: Female   Other Topics Concern  . Not on file   Social History Narrative   Swimming for exercise.     Family History  Problem Relation Age of Onset  . Hypertension Mother   . Hyperlipidemia Father     Outpatient Encounter Prescriptions as of 11/25/2015  Medication Sig  . allopurinol (ZYLOPRIM) 100 MG tablet TAKE 1 TABLET (100 MG TOTAL) BY MOUTH DAILY.  Marland Kitchen amLODipine (NORVASC) 5 MG tablet Take 1 tablet every day  . cetirizine (ZYRTEC) 10 MG tablet Take 10 mg by mouth daily.  . cholecalciferol (VITAMIN D) 1000 UNITS tablet Take 1,000 Units by mouth daily.  . Coenzyme Q10-Red Yeast Rice (CO Q-10 PLUS RED YEAST RICE PO) Take 2 capsules by  mouth daily.  . colchicine (COLCRYS) 0.6 MG tablet Take 1 tablet (0.6 mg total) by mouth as needed.  . latanoprost (XALATAN) 0.005 % ophthalmic solution 1 drop at bedtime.  Marland Kitchen olopatadine (PATANOL) 0.1 % ophthalmic solution Place 1 drop into both eyes 2 (two) times daily.  Marland Kitchen SHARK CARTILAGE PO Take 2 capsules by mouth daily.  Marland Kitchen spironolactone (ALDACTONE) 25 MG tablet Take 1 tablet (25 mg total) by mouth daily.  . timolol (BETIMOL) 0.25 % ophthalmic solution 1-2 drops 2 (two) times daily.  . [DISCONTINUED] allopurinol (ZYLOPRIM) 100 MG tablet TAKE 1 TABLET (100 MG TOTAL) BY MOUTH DAILY.  . [DISCONTINUED] amLODipine (NORVASC) 5 MG tablet Take 1 tablet every day  . [DISCONTINUED] atenolol (TENORMIN) 100 MG tablet TAKE 1 TABLET (100 MG TOTAL) BY MOUTH DAILY. (Patient taking differently: Take 50 mg by mouth daily. TAKE 1 TABLET (100 MG TOTAL) BY MOUTH DAILY.)  . [DISCONTINUED] spironolactone (ALDACTONE) 25 MG tablet Take 1 tablet (25 mg total) by mouth daily.  Marland Kitchen atenolol (TENORMIN) 50 MG tablet Take 1 tablet (50 mg total) by mouth daily.  . [DISCONTINUED] azelastine (ASTELIN) 0.1 %  nasal spray Place 2 sprays into the nose.  . [DISCONTINUED] cyclobenzaprine (FLEXERIL) 10 MG tablet One half tab PO qHS, then increase gradually to one tab TID. (Patient not taking: Reported on 05/03/2015)  . [DISCONTINUED] meloxicam (MOBIC) 15 MG tablet One tab PO qAM with breakfast for 2 weeks, then daily prn pain. (Patient not taking: Reported on 05/03/2015)  . [DISCONTINUED] traMADol-acetaminophen (ULTRACET) 37.5-325 MG per tablet Take 1-2 tablets by mouth every 8 (eight) hours as needed for severe pain. (Patient not taking: Reported on 05/03/2015)   No facility-administered encounter medications on file as of 11/25/2015.           Objective:   Physical Exam  Constitutional: He is oriented to person, place, and time. He appears well-developed and well-nourished.  HENT:  Head: Normocephalic and atraumatic.  Right  Ear: External ear normal.  Left Ear: External ear normal.  Nose: Nose normal.  Mouth/Throat: Oropharynx is clear and moist.  Eyes: Conjunctivae and EOM are normal. Pupils are equal, round, and reactive to light.  Neck: Normal range of motion. Neck supple. No thyromegaly present.  Cardiovascular: Normal rate, regular rhythm, normal heart sounds and intact distal pulses.   Pulmonary/Chest: Effort normal and breath sounds normal.  Abdominal: Soft. Bowel sounds are normal. He exhibits no distension and no mass. There is no tenderness. There is no rebound and no guarding.  Musculoskeletal: Normal range of motion.  Lymphadenopathy:    He has no cervical adenopathy.  Neurological: He is alert and oriented to person, place, and time. He has normal reflexes.  Skin: Skin is warm and dry.  Psychiatric: He has a normal mood and affect. His behavior is normal. Judgment and thought content normal.        Assessment & Plan:  Keep up a regular exercise program and make sure you are eating a healthy diet Try to eat 4 servings of dairy a day, or if you are lactose intolerant take a calcium with vitamin D daily.  Your vaccines are up to date.  We'll add PSA to his blood work. Rec Hep C screening.   Declined flu vaccine.    Discussed colon Cancer screening.    HTN - well controlled. F/U in 6 mo.

## 2015-11-26 LAB — PSA: PSA: 2.35 ng/mL (ref <=4.00)

## 2015-11-26 LAB — HEPATITIS C ANTIBODY: HCV Ab: NEGATIVE

## 2015-11-27 ENCOUNTER — Telehealth: Payer: Self-pay | Admitting: Family Medicine

## 2015-11-27 NOTE — Telephone Encounter (Signed)
Pt's wife advised, she will come pick up cards.

## 2015-11-27 NOTE — Telephone Encounter (Signed)
Yes, that's fine 

## 2015-11-27 NOTE — Telephone Encounter (Signed)
Spoke with Pt wife, states insurance will not cover Cologuard. Pt refuses to get a colonoscopy due to a friend that passed away due to bowel rupture during their colonoscopy. Pt is willing to do the three step Hemoccult cards. Will route to PCP to see if this would be appropriate.

## 2015-12-12 ENCOUNTER — Other Ambulatory Visit: Payer: Self-pay | Admitting: *Deleted

## 2015-12-12 DIAGNOSIS — Z1211 Encounter for screening for malignant neoplasm of colon: Secondary | ICD-10-CM

## 2015-12-12 LAB — POC HEMOCCULT BLD/STL (HOME/3-CARD/SCREEN)
Card #2 Fecal Occult Blod, POC: NEGATIVE
Card #3 Fecal Occult Blood, POC: NEGATIVE
Fecal Occult Blood, POC: NEGATIVE

## 2016-01-02 ENCOUNTER — Telehealth: Payer: Self-pay

## 2016-01-02 DIAGNOSIS — M5416 Radiculopathy, lumbar region: Secondary | ICD-10-CM

## 2016-01-02 NOTE — Telephone Encounter (Signed)
Pt back pain is back and would like to have injections ordered. Please assist.

## 2016-01-03 ENCOUNTER — Ambulatory Visit (INDEPENDENT_AMBULATORY_CARE_PROVIDER_SITE_OTHER): Payer: 59 | Admitting: Sports Medicine

## 2016-01-03 ENCOUNTER — Ambulatory Visit
Admission: RE | Admit: 2016-01-03 | Discharge: 2016-01-03 | Disposition: A | Discharge status: Home / Self Care | Payer: 59 | Source: Ambulatory Visit | Attending: Sports Medicine | Admitting: Sports Medicine

## 2016-01-03 VITALS — BP 139/84 | HR 56 | Resp 16 | Wt 170.0 lb

## 2016-01-03 DIAGNOSIS — M5416 Radiculopathy, lumbar region: Secondary | ICD-10-CM

## 2016-01-03 DIAGNOSIS — R7301 Impaired fasting glucose: Secondary | ICD-10-CM | POA: Insufficient documentation

## 2016-01-03 DIAGNOSIS — H40059 Ocular hypertension, unspecified eye: Secondary | ICD-10-CM | POA: Insufficient documentation

## 2016-01-03 DIAGNOSIS — Z79899 Other long term (current) drug therapy: Secondary | ICD-10-CM | POA: Insufficient documentation

## 2016-01-03 DIAGNOSIS — H401130 Primary open-angle glaucoma, bilateral, stage unspecified: Secondary | ICD-10-CM | POA: Insufficient documentation

## 2016-01-03 DIAGNOSIS — E785 Hyperlipidemia, unspecified: Secondary | ICD-10-CM | POA: Insufficient documentation

## 2016-01-03 DIAGNOSIS — N200 Calculus of kidney: Secondary | ICD-10-CM | POA: Insufficient documentation

## 2016-01-03 DIAGNOSIS — I10 Essential (primary) hypertension: Secondary | ICD-10-CM | POA: Insufficient documentation

## 2016-01-03 DIAGNOSIS — J309 Allergic rhinitis, unspecified: Secondary | ICD-10-CM | POA: Insufficient documentation

## 2016-01-03 MED ORDER — IOHEXOL 180 MG/ML  SOLN
1.0000 mL | Freq: Once | INTRAMUSCULAR | Status: AC | PRN
Start: 2016-01-03 — End: 2016-01-03
  Administered 2016-01-03: 1 mL via EPIDURAL

## 2016-01-03 MED ORDER — METHYLPREDNISOLONE ACETATE 40 MG/ML INJ SUSP (RADIOLOG
120.0000 mg | Freq: Once | INTRAMUSCULAR | Status: AC
  Administered 2016-01-03: 120 mg via EPIDURAL

## 2016-01-03 NOTE — Discharge Instructions (Signed)

## 2016-01-03 NOTE — Assessment & Plan Note (Signed)
Fantastic response to a left S1 transforaminal epidural in the past, did extremely well and is requesting another one. We did plan on an L5-S1 bilateral facet injection, but this was not needed. But me know in one month how things are going after the S1 epidural, certainly we can proceed with a bilateral L5-S1 facet injection if axial pain persists.

## 2016-01-03 NOTE — Progress Notes (Signed)
  Subjective:    CC: Follow-up  HPI: This is a pleasant 60 year old male with known S1 radiculopathy, he had a left-sided S1 transforaminal epidural in June 2015, he is having recurrence of pain and desires repeat left-sided epidural, pain is moderate, persistent with radiation down the left leg, no bowel or bladder dysfunction, no saddle numbness, not taking any antiplatelet agents.  Past medical history, Surgical history, Family history not pertinant except as noted below, Social history, Allergies, and medications have been entered into the medical record, reviewed, and no changes needed.   Review of Systems: No fevers, chills, night sweats, weight loss, chest pain, or shortness of breath.   Objective:    General: Well Developed, well nourished, and in no acute distress.  Neuro: Alert and oriented x3, extra-ocular muscles intact, sensation grossly intact.  HEENT: Normocephalic, atraumatic, pupils equal round reactive to light, neck supple, no masses, no lymphadenopathy, thyroid nonpalpable.  Skin: Warm and dry, no rashes. Cardiac: Regular rate and rhythm, no murmurs rubs or gallops, no lower extremity edema.  Respiratory: Clear to auscultation bilaterally. Not using accessory muscles, speaking in full sentences.  Impression and Recommendations:   I spent 25 minutes with this patient, greater than 50% was face-to-face time counseling regarding the above diagnoses

## 2016-01-03 NOTE — Telephone Encounter (Signed)
Orders placed for left S1 transforaminal epidural, I assume he had a good relief after the most recent one in 2015?

## 2016-01-03 NOTE — Addendum Note (Signed)
Addended by: Baird Kay on: 01/03/2016 09:15 AM   Modules accepted: Orders, Medications

## 2016-02-10 ENCOUNTER — Other Ambulatory Visit: Payer: Self-pay | Admitting: *Deleted

## 2016-02-10 MED ORDER — AMBULATORY NON FORMULARY MEDICATION: Status: DC

## 2016-05-20 ENCOUNTER — Encounter: Payer: Self-pay | Admitting: Sports Medicine

## 2016-05-22 ENCOUNTER — Other Ambulatory Visit: Payer: Self-pay | Admitting: Family Medicine

## 2016-05-29 ENCOUNTER — Ambulatory Visit: Payer: 59 | Admitting: Family Medicine

## 2016-06-22 ENCOUNTER — Encounter: Payer: Self-pay | Admitting: Family Medicine

## 2016-06-22 ENCOUNTER — Ambulatory Visit (INDEPENDENT_AMBULATORY_CARE_PROVIDER_SITE_OTHER): Payer: 59 | Admitting: Family Medicine

## 2016-06-22 VITALS — BP 109/74 | HR 50 | Resp 16 | Ht 65.0 in | Wt 160.0 lb

## 2016-06-22 DIAGNOSIS — R7301 Impaired fasting glucose: Secondary | ICD-10-CM

## 2016-06-22 DIAGNOSIS — I10 Essential (primary) hypertension: Secondary | ICD-10-CM

## 2016-06-22 DIAGNOSIS — R7989 Other specified abnormal findings of blood chemistry: Secondary | ICD-10-CM

## 2016-06-22 DIAGNOSIS — M1A09X0 Idiopathic chronic gout, multiple sites, without tophus (tophi): Secondary | ICD-10-CM

## 2016-06-22 DIAGNOSIS — E559 Vitamin D deficiency, unspecified: Secondary | ICD-10-CM

## 2016-06-22 DIAGNOSIS — R748 Abnormal levels of other serum enzymes: Secondary | ICD-10-CM

## 2016-06-22 LAB — POCT GLYCOSYLATED HEMOGLOBIN (HGB A1C): Hemoglobin A1C: 5.6

## 2016-06-22 MED ORDER — ATENOLOL 25 MG PO TABS: 25.0000 mg | ORAL_TABLET | Freq: Every day | ORAL | 0 refills | Status: DC

## 2016-06-22 NOTE — Progress Notes (Signed)
Subjective:    CC: HTN  HPI: Hypertension- Pt denies chest pain, SOB, dizziness, or heart palpitations.  Taking meds as directed w/o problems.  Denies medication side effects.  He is now taking tumeric and feels that has really helped.    Vit D def - doing well. Taking 1000 international units daily.  Gout - On allopurinol 100mg  daily.  NO recent flares.    Past medical history, Surgical history, Family history not pertinant except as noted below, Social history, Allergies, and medications have been entered into the medical record, reviewed, and corrections made.   Review of Systems: No fevers, chills, night sweats, weight loss, chest pain, or shortness of breath.   Objective:    General: Well Developed, well nourished, and in no acute distress.  Neuro: Alert and oriented x3, extra-ocular muscles intact, sensation grossly intact.  HEENT: Normocephalic, atraumatic  Skin: Warm and dry, no rashes. Cardiac: Regular rate and rhythm, no murmurs rubs or gallops, no lower extremity edema. No carotid bruits. Respiratory: Clear to auscultation bilaterally. Not using accessory muscles, speaking in full sentences.   Impression and Recommendations:   HTN- Well controlled. Continue current regimen. Follow up in  6 mo.   Vit D def - Due to recheck levels.    Gout - Well controlled. Recheck uric acid.  Elevated BS - well controlled.follow periodically.   Lab Results  Component Value Date   HGBA1C 5.6 06/22/2016

## 2016-06-23 ENCOUNTER — Telehealth: Payer: Self-pay | Admitting: Family Medicine

## 2016-06-23 LAB — BASIC METABOLIC PANEL WITH GFR
BUN: 18 mg/dL (ref 7–25)
CO2: 23 mmol/L (ref 20–31)
Calcium: 9.4 mg/dL (ref 8.6–10.3)
Chloride: 107 mmol/L (ref 98–110)
Creat: 1.35 mg/dL — ABNORMAL HIGH (ref 0.70–1.25)
GFR, Est African American: 65 mL/min (ref >=60)
GFR, Est Non African American: 56 mL/min — ABNORMAL LOW (ref >=60)
Glucose, Bld: 110 mg/dL — ABNORMAL HIGH (ref 65–99)
Potassium: 4.6 mmol/L (ref 3.5–5.3)
Sodium: 139 mmol/L (ref 135–146)

## 2016-06-23 LAB — VITAMIN D 25 HYDROXY (VIT D DEFICIENCY, FRACTURES): Vit D, 25-Hydroxy: 30 ng/mL (ref 30–100)

## 2016-06-23 LAB — URIC ACID: Uric Acid, Serum: 6.1 mg/dL (ref 4.0–8.0)

## 2016-06-23 NOTE — Telephone Encounter (Signed)
Pt wife advised of recommendation, will have Pt go to lab hydrated for redraw.

## 2016-06-23 NOTE — Telephone Encounter (Signed)
That might explain the abnormality. I still would recommend that we recheck his renal function. He just needs to make sure that he is hydrated while the morning that he goes to have it done.

## 2016-06-23 NOTE — Addendum Note (Signed)
Addended by: Deno EtienneBARKLEY, Rosea Dory L on: 06/23/2016 11:04 AM   Modules accepted: Orders

## 2016-06-23 NOTE — Telephone Encounter (Signed)
Wife called to say when Pt got his blood work done he had just left a 3 hour workout which included 20+ minutes in the sauna. Questions if that could be why his blood work was questionable. Wants to know if Pt still needs to get labs rechecked and spend $40. Will route.

## 2016-07-11 LAB — BASIC METABOLIC PANEL WITH GFR
BUN: 17 mg/dL (ref 7–25)
CO2: 26 mmol/L (ref 20–31)
Calcium: 9 mg/dL (ref 8.6–10.3)
Chloride: 103 mmol/L (ref 98–110)
Creat: 1.14 mg/dL (ref 0.70–1.25)
GFR, Est African American: 79 mL/min (ref >=60)
GFR, Est Non African American: 69 mL/min (ref >=60)
Glucose, Bld: 93 mg/dL (ref 65–99)
Potassium: 4.7 mmol/L (ref 3.5–5.3)
Sodium: 137 mmol/L (ref 135–146)

## 2016-07-11 LAB — URINALYSIS, ROUTINE W REFLEX MICROSCOPIC
Bilirubin Urine: NEGATIVE
Glucose, UA: NEGATIVE
Hgb urine dipstick: NEGATIVE
Ketones, ur: NEGATIVE
Leukocytes, UA: NEGATIVE
Nitrite: NEGATIVE
Protein, ur: NEGATIVE
Specific Gravity, Urine: 1.008 (ref 1.001–1.035)
pH: 7 (ref 5.0–8.0)

## 2016-08-12 ENCOUNTER — Other Ambulatory Visit: Payer: Self-pay | Admitting: Family Medicine

## 2016-10-22 ENCOUNTER — Telehealth: Payer: Self-pay

## 2016-10-22 DIAGNOSIS — M5416 Radiculopathy, lumbar region: Secondary | ICD-10-CM

## 2016-10-22 NOTE — Telephone Encounter (Signed)
Wife of pt called stating his back pain is back and is need of another epidural. Please assist.

## 2016-10-22 NOTE — Telephone Encounter (Signed)
Orders placed.

## 2016-11-23 ENCOUNTER — Other Ambulatory Visit: Payer: Self-pay | Admitting: Family Medicine

## 2016-12-23 ENCOUNTER — Ambulatory Visit (INDEPENDENT_AMBULATORY_CARE_PROVIDER_SITE_OTHER): Payer: 59 | Admitting: Physician Assistant

## 2016-12-23 ENCOUNTER — Encounter: Payer: Self-pay | Admitting: Physician Assistant

## 2016-12-23 VITALS — BP 118/73 | HR 72 | Temp 98.7°F | Wt 155.0 lb

## 2016-12-23 DIAGNOSIS — J342 Deviated nasal septum: Secondary | ICD-10-CM | POA: Insufficient documentation

## 2016-12-23 DIAGNOSIS — R6889 Other general symptoms and signs: Secondary | ICD-10-CM | POA: Diagnosis not present

## 2016-12-23 LAB — POCT INFLUENZA A/B
Influenza A, POC: NEGATIVE
Influenza B, POC: NEGATIVE

## 2016-12-23 MED ORDER — OSELTAMIVIR PHOSPHATE 75 MG PO CAPS: 75.0000 mg | ORAL_CAPSULE | Freq: Two times a day (BID) | ORAL | 0 refills | Status: DC

## 2016-12-23 NOTE — Progress Notes (Signed)
HPI:                                                                Christopher Gregory is a 61 y.o. male who presents to [facility] today for   Patient reports myalgias beginning last night at raquetball practice. Reports a fever of 100.2, headache, and weakness. He has been taking Advil every 4 hours and this has helped.  Denies sore throat, cough, congestion. Denies n/v/d/abdominal pain. Former smoker, quit >20 years ago. No history of asthma or COPD. He did not receive the influenza vaccine this year. His wife states daughter at home is complaining of similar symptoms.   Past Medical History:  Diagnosis Date  . Gout   . Hypertension    Past Surgical History:  Procedure Laterality Date  . KNEE ARTHROSCOPY    . TOTAL HIP ARTHROPLASTY  2000  . TOTAL HIP ARTHROPLASTY  2004   Social History  Substance Use Topics  . Smoking status: Former Smoker    Types: Cigarettes    Quit date: 11/26/1973  . Smokeless tobacco: Never Used  . Alcohol use No   family history includes Hyperlipidemia in his father; Hypertension in his mother.  ROS: negative except as noted in the HPI  Medications: Current Outpatient Prescriptions  Medication Sig Dispense Refill  . allopurinol (ZYLOPRIM) 100 MG tablet TAKE 1 TABLET (100 MG TOTAL) BY MOUTH DAILY. 90 tablet 1  . amLODipine (NORVASC) 5 MG tablet TAKE 1 TABLET EVERY DAY 90 tablet 1  . atenolol (TENORMIN) 25 MG tablet Take 1 tablet (25 mg total) by mouth daily. 1 tablet 0  . Azelastine HCl 0.15 % SOLN INHALE 1 SPRAY INTO BOTH NOSTRILS TWICE A DAY  5  . cetirizine (ZYRTEC) 10 MG tablet Take 10 mg by mouth daily.    . cholecalciferol (VITAMIN D) 1000 UNITS tablet Take 1,000 Units by mouth daily.    . Coenzyme Q10-Red Yeast Rice (CO Q-10 PLUS RED YEAST RICE PO) Take 2 capsules by mouth daily.    . colchicine (COLCRYS) 0.6 MG tablet Take 1 tablet (0.6 mg total) by mouth as needed. 90 tablet 2  . doxylamine, Sleep,  (UNISOM) 25 MG tablet Take by mouth.    . latanoprost (XALATAN) 0.005 % ophthalmic solution 1 drop at bedtime.    Marland Kitchen spironolactone (ALDACTONE) 25 MG tablet Take 1 tablet (25 mg total) by mouth daily. 90 tablet 2  . timolol (BETIMOL) 0.25 % ophthalmic solution 1-2 drops 2 (two) times daily.     No current facility-administered medications for this visit.    Allergies  Allergen Reactions  . Ace Inhibitors Other (See Comments)    Allergic reaction  . Amlodipine     Swelling  . Neomycin     Hives  . Erythromycin Rash       Objective:  BP 118/73   Pulse 72   Temp 98.7 F (37.1 C) (Oral)   Wt 155 lb (70.3 kg)   BMI 25.79 kg/m  Gen: well-groomed, cooperative, not ill-appearing, no distress HEENT: normal conjunctiva, TM's clear, deviated nasal septum, oropharynx clear, moist mucus membranes, no frontal or maxillary sinus tenderness Pulm: Normal work of breathing, normal phonation, clear to auscultation bilaterally, no wheezes, rales or rhonchi  CV: Normal rate, regular rhythm, s1 and s2 distinct, no murmurs, clicks or rubs  GI: soft, nondistended, nontender Neuro: alert and oriented x 3, EOM's intact Lymph: no cervical or tonsillar adenopathy Skin: warm and dry, no rashes or lesions on exposed skin, no cyanosis   No results found for this or any previous visit (from the past 72 hour(s)). No results found.  Lab Results  Component Value Date   CREATININE 1.14 07/10/2016     Assessment and Plan: 61 y.o. male with    Flu-like symptoms - POCT Influenza A/B negative. He is within the window for Tamiflu - checked most recent Scr 1.14 and GFR 69 (07/2016) - Tamiflu  75mg  bid x 5 days - encouraged PO hydration - symptomatic management with alternating Tylenol and Ibuprofen q6h  Patient education and anticipatory guidance given Patient agrees with treatment plan Follow-up in 1 week with PCP or sooner as needed  Levonne Hubertharley E. Di Jasmer PA-C

## 2016-12-23 NOTE — Patient Instructions (Addendum)
Drink extra water and clear liquids Ibuprofen 400mg  every 6 hours for fever/body aches Alternate Tylenol 500mg  in between dosing of Ibuprofen  Influenza, Adult Influenza, more commonly known as "the flu," is a viral infection that primarily affects the respiratory tract. The respiratory tract includes organs that help you breathe, such as the lungs, nose, and throat. The flu causes many common cold symptoms, as well as a high fever and body aches. The flu spreads easily from person to person (is contagious). Getting a flu shot (influenza vaccination) every year is the best way to prevent influenza. What are the causes? Influenza is caused by a virus. You can catch the virus by:  Breathing in droplets from an infected person's cough or sneeze.  Touching something that was recently contaminated with the virus and then touching your mouth, nose, or eyes. What increases the risk? The following factors may make you more likely to get the flu:  Not cleaning your hands frequently with soap and water or alcohol-based hand sanitizer.  Having close contact with many people during cold and flu season.  Touching your mouth, eyes, or nose without washing or sanitizing your hands first.  Not drinking enough fluids or not eating a healthy diet.  Not getting enough sleep or exercise.  Being under a high amount of stress.  Not getting a yearly (annual) flu shot. You may be at a higher risk of complications from the flu, such as a severe lung infection (pneumonia), if you:  Are over the age of 63.  Are pregnant.  Have a weakened disease-fighting system (immune system). You may have a weakened immune system if you:  Have HIV or AIDS.  Are undergoing chemotherapy.  Aretaking medicines that reduce the activity of (suppress) the immune system.  Have a long-term (chronic) illness, such as heart disease, kidney disease, diabetes, or lung disease.  Have a liver disorder.  Are obese.  Have  anemia. What are the signs or symptoms? Symptoms of this condition typically last 4-10 days and may include:  Fever.  Chills.  Headache, body aches, or muscle aches.  Sore throat.  Cough.  Runny or congested nose.  Chest discomfort and cough.  Poor appetite.  Weakness or tiredness (fatigue).  Dizziness.  Nausea or vomiting. How is this diagnosed? This condition may be diagnosed based on your medical history and a physical exam. Your health care provider may do a nose or throat swab test to confirm the diagnosis. How is this treated? If influenza is detected early, you can be treated with antiviral medicine that can reduce the length of your illness and the severity of your symptoms. This medicine may be given by mouth (orally) or through an IV tube that is inserted in one of your veins. The goal of treatment is to relieve symptoms by taking care of yourself at home. This may include taking over-the-counter medicines, drinking plenty of fluids, and adding humidity to the air in your home. In some cases, influenza goes away on its own. Severe influenza or complications from influenza may be treated in a hospital. Follow these instructions at home:  Take over-the-counter and prescription medicines only as told by your health care provider.  Use a cool mist humidifier to add humidity to the air in your home. This can make breathing easier.  Rest as needed.  Drink enough fluid to keep your urine clear or pale yellow.  Cover your mouth and nose when you cough or sneeze.  Wash your hands with soap  and water often, especially after you cough or sneeze. If soap and water are not available, use hand sanitizer.  Stay home from work or school as told by your health care provider. Unless you are visiting your health care provider, try to avoid leaving home until your fever has been gone for 24 hours without the use of medicine.  Keep all follow-up visits as told by your health care  provider. This is important. How is this prevented?  Getting an annual flu shot is the best way to avoid getting the flu. You may get the flu shot in late summer, fall, or winter. Ask your health care provider when you should get your flu shot.  Wash your hands often or use hand sanitizer often.  Avoid contact with people who are sick during cold and flu season.  Eat a healthy diet, drink plenty of fluids, get enough sleep, and exercise regularly. Contact a health care provider if:  You develop new symptoms.  You have:  Chest pain.  Diarrhea.  A fever.  Your cough gets worse.  You produce more mucus.  You feel nauseous or you vomit. Get help right away if:  You develop shortness of breath or difficulty breathing.  Your skin or nails turn a bluish color.  You have severe pain or stiffness in your neck.  You develop a sudden headache or sudden pain in your face or ear.  You cannot stop vomiting. This information is not intended to replace advice given to you by your health care provider. Make sure you discuss any questions you have with your health care provider. Document Released: 10/23/2000 Document Revised: 04/02/2016 Document Reviewed: 08/20/2015 Elsevier Interactive Patient Education  2017 [employer].

## 2016-12-28 ENCOUNTER — Encounter: Payer: Self-pay | Admitting: Family Medicine

## 2016-12-28 ENCOUNTER — Ambulatory Visit (INDEPENDENT_AMBULATORY_CARE_PROVIDER_SITE_OTHER): Payer: 59 | Admitting: Family Medicine

## 2016-12-28 ENCOUNTER — Telehealth: Payer: Self-pay | Admitting: Family Medicine

## 2016-12-28 VITALS — BP 115/60 | HR 50 | Ht 65.0 in | Wt 153.0 lb

## 2016-12-28 DIAGNOSIS — Z0000 Encounter for general adult medical examination without abnormal findings: Secondary | ICD-10-CM | POA: Diagnosis not present

## 2016-12-28 DIAGNOSIS — R7301 Impaired fasting glucose: Secondary | ICD-10-CM

## 2016-12-28 DIAGNOSIS — I10 Essential (primary) hypertension: Secondary | ICD-10-CM | POA: Diagnosis not present

## 2016-12-28 LAB — POCT GLYCOSYLATED HEMOGLOBIN (HGB A1C): Hemoglobin A1C: 5.5

## 2016-12-28 LAB — COMPLETE METABOLIC PANEL WITH GFR
ALT: 23 U/L (ref 9–46)
AST: 20 U/L (ref 10–35)
Albumin: 4.1 g/dL (ref 3.6–5.1)
Alkaline Phosphatase: 33 U/L — ABNORMAL LOW (ref 40–115)
BUN: 18 mg/dL (ref 7–25)
CO2: 25 mmol/L (ref 20–31)
Calcium: 8.9 mg/dL (ref 8.6–10.3)
Chloride: 107 mmol/L (ref 98–110)
Creat: 1 mg/dL (ref 0.70–1.25)
GFR, Est African American: >89 mL/min (ref >=60)
GFR, Est Non African American: 80 mL/min (ref >=60)
Glucose, Bld: 126 mg/dL — ABNORMAL HIGH (ref 65–99)
Potassium: 4.4 mmol/L (ref 3.5–5.3)
Sodium: 138 mmol/L (ref 135–146)
Total Bilirubin: 1.1 mg/dL (ref 0.2–1.2)
Total Protein: 6.1 g/dL (ref 6.1–8.1)

## 2016-12-28 LAB — PSA: PSA: 2.4 ng/mL (ref <=4.0)

## 2016-12-28 LAB — LIPID PANEL W/REFLEX DIRECT LDL
Cholesterol: 120 mg/dL (ref <200)
HDL: 26 mg/dL — ABNORMAL LOW (ref >40)
LDL-Cholesterol: 72 mg/dL
Non-HDL Cholesterol (Calc): 94 mg/dL (ref <130)
Total CHOL/HDL Ratio: 4.6 Ratio (ref <5.0)
Triglycerides: 138 mg/dL (ref <150)

## 2016-12-28 NOTE — Patient Instructions (Addendum)
Stop your atenolol.   You are welcome to bring your home BP cuff in with you at the nurse visit.

## 2016-12-28 NOTE — Telephone Encounter (Signed)
Patient declined annual flu shot. - ML

## 2016-12-28 NOTE — Progress Notes (Signed)
Subjective:    CC: Wellness Exam   HPI:  61 yo male here for CPE.    Hypertension- Pt denies chest pain, SOB, dizziness, or heart palpitations.  Taking meds as directed w/o problems.  Denies medication side effects.    IFG - has lost more weight . Racketball ball 4 days per week.   Past medical history, Surgical history, Family history not pertinant except as noted below, Social history, Allergies, and medications have been entered into the medical record, reviewed, and corrections made.   Review of Systems: No fevers, chills, night sweats, weight loss, chest pain, or shortness of breath.   Objective:    Physical Exam  Constitutional: He is oriented to person, place, and time. He appears well-developed and well-nourished.  HENT:  Head: Normocephalic and atraumatic.  Right Ear: External ear normal.  Left Ear: External ear normal.  Nose: Nose normal.  Mouth/Throat: Oropharynx is clear and moist.  Eyes: Conjunctivae and EOM are normal. Pupils are equal, round, and reactive to light.  Neck: Normal range of motion. Neck supple. No thyromegaly present.  Cardiovascular: Normal rate, regular rhythm, normal heart sounds and intact distal pulses.   Pulmonary/Chest: Effort normal and breath sounds normal.  Abdominal: Soft. Bowel sounds are normal. He exhibits no distension and no mass. There is no tenderness. There is no rebound and no guarding.  Musculoskeletal: Normal range of motion.  Lymphadenopathy:    He has no cervical adenopathy.  Neurological: He is alert and oriented to person, place, and time. He has normal reflexes.  Skin: Skin is warm and dry.  Psychiatric: He has a normal mood and affect. His behavior is normal. Judgment and thought content normal.     Impression and Recommendations:    CPE- Keep up a regular exercise program and make sure you are eating a healthy diet Try to eat 4 servings of dairy a day, or if you are lactose intolerant take a calcium with vitamin D  daily.  Your vaccines are up to date.   IFG - looks fantastic at 5.5.  Discussed colon cancer screening  HTN -well controlled. Stop atenolol. He wants to lose 10 more lbs.   Due to recheck lipoids.

## 2016-12-31 ENCOUNTER — Other Ambulatory Visit: Payer: Self-pay | Admitting: Family Medicine

## 2017-01-25 ENCOUNTER — Ambulatory Visit: Payer: 59

## 2017-02-05 ENCOUNTER — Other Ambulatory Visit: Payer: Self-pay | Admitting: Family Medicine

## 2017-02-08 ENCOUNTER — Telehealth: Payer: Self-pay | Admitting: Family Medicine

## 2017-02-08 NOTE — Telephone Encounter (Signed)
Christopher Gregory (Brnadon's wife) came into the office today to speak to me about [name] 's Blood Pressure.  At his annual visit, it stated to stop his Atenolol.  He had stopped his Atenolol and felt like his BP was going back up so, he went back on the Atenolol.  He has been taking the  and cutting them in half and taking 12.5mg  daily.  His BP's have been good, running on average 123/78.  He would like a 90-day refill called into [facility] for the Atenolol .  They will continue to cut them in half.  Please have Tonya call them after the refill has been sent in.

## 2017-02-10 ENCOUNTER — Other Ambulatory Visit: Payer: Self-pay

## 2017-02-10 MED ORDER — ATENOLOL 25 MG PO TABS: 12.5000 mg | ORAL_TABLET | Freq: Every day | ORAL | 0 refills | Status: DC

## 2017-02-10 NOTE — Telephone Encounter (Signed)
Put in your box to sign

## 2017-02-10 NOTE — Telephone Encounter (Signed)
OK to refill. Thank you for 90 day.

## 2017-03-17 ENCOUNTER — Telehealth: Payer: Self-pay | Admitting: *Deleted

## 2017-03-17 NOTE — Telephone Encounter (Signed)
Physician results form completed, copied, scanned and faxed. Confirmation received .Loralee PacasBarkley, Ivaan Liddy [place]

## 2017-05-24 ENCOUNTER — Other Ambulatory Visit: Payer: Self-pay | Admitting: Family Medicine

## 2017-08-02 ENCOUNTER — Ambulatory Visit (INDEPENDENT_AMBULATORY_CARE_PROVIDER_SITE_OTHER): Payer: 59 | Admitting: Family Medicine

## 2017-08-02 ENCOUNTER — Encounter: Payer: Self-pay | Admitting: Family Medicine

## 2017-08-02 VITALS — BP 124/78 | HR 56 | Ht 65.0 in | Wt 155.0 lb

## 2017-08-02 DIAGNOSIS — I10 Essential (primary) hypertension: Secondary | ICD-10-CM

## 2017-08-02 DIAGNOSIS — J302 Other seasonal allergic rhinitis: Secondary | ICD-10-CM

## 2017-08-02 DIAGNOSIS — R3129 Other microscopic hematuria: Secondary | ICD-10-CM | POA: Diagnosis not present

## 2017-08-02 DIAGNOSIS — M1A09X0 Idiopathic chronic gout, multiple sites, without tophus (tophi): Secondary | ICD-10-CM | POA: Diagnosis not present

## 2017-08-02 MED ORDER — ALLOPURINOL 100 MG PO TABS: ORAL_TABLET | ORAL | 1 refills | Status: DC

## 2017-08-02 MED ORDER — AMLODIPINE BESYLATE 5 MG PO TABS: 5.0000 mg | ORAL_TABLET | Freq: Every day | ORAL | 1 refills | Status: DC

## 2017-08-02 MED ORDER — SPIRONOLACTONE 25 MG PO TABS: 25.0000 mg | ORAL_TABLET | Freq: Every day | ORAL | 1 refills | Status: DC

## 2017-08-02 MED ORDER — ATENOLOL 25 MG PO TABS: 12.5000 mg | ORAL_TABLET | Freq: Every day | ORAL | 0 refills | Status: DC

## 2017-08-02 NOTE — Progress Notes (Signed)
Subjective:    CC: BP, Gout  HPI:  Hypertension- Pt denies chest pain, SOB, dizziness, or heart palpitations.  Taking meds as directed w/o problems.  Denies medication side effects.  He is still taking half a tab of the atenolol and doing well with this. Home blood pressures running in the low 120s over low 80s.  Gout - He has not had nay flares in the last year. Taking his allopurinol daily.   Thinks he may have passed a kidney stone a few months ago. Just FYI. 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 They told him to mention it to his Doc.   Allergic rhinitis - takes over-the-counter Zyrtec.  Past medical history, Surgical history, Family history not pertinant except as noted below, Social history, Allergies, and medications have been entered into the medical record, reviewed, and corrections made.   Review of Systems: No fevers, chills, night sweats, weight loss, chest pain, or shortness of breath.   Objective:    General: Well Developed, well nourished, and in no acute distress.  Neuro: Alert and oriented x3, extra-ocular muscles intact, sensation grossly intact.  HEENT: Normocephalic, atraumatic  Skin: Warm and dry, no rashes. Cardiac: Regular rate and rhythm, no murmurs rubs or gallops, no lower extremity edema.  Respiratory: Clear to auscultation bilaterally. Not using accessory muscles, speaking in full sentences.   Impression and Recommendations:    HTN - Well controlled. Continue current regimen. Due for BMP. Follow up in  6 months.   Gout  - Stable. He is doing fantastic. Continue current regimen.  Microscopic hematuria-we'll repeat urine today to confirm to see if he really does have blood. He really needs a microscopic review to confirm whole red blood cells which I explained to him. If it comes back positive then we will need to get a second sample for confirmation before continuing additional workup.  Allergic rhinitis-continue over-the-counter Zyrtec.

## 2017-08-03 LAB — BASIC METABOLIC PANEL WITH GFR
BUN/Creatinine Ratio: 20 (calc) (ref 6–22)
BUN: 26 mg/dL — ABNORMAL HIGH (ref 7–25)
CO2: 25 mmol/L (ref 20–32)
Calcium: 9.6 mg/dL (ref 8.6–10.3)
Chloride: 104 mmol/L (ref 98–110)
Creat: 1.27 mg/dL — ABNORMAL HIGH (ref 0.70–1.25)
GFR, Est African American: 69 mL/min/{1.73_m2} (ref >=60)
GFR, Est Non African American: 60 mL/min/{1.73_m2} (ref >=60)
Glucose, Bld: 114 mg/dL — ABNORMAL HIGH (ref 65–99)
Potassium: 4.3 mmol/L (ref 3.5–5.3)
Sodium: 138 mmol/L (ref 135–146)

## 2017-08-03 LAB — URINALYSIS, ROUTINE W REFLEX MICROSCOPIC
Bacteria, UA: NONE SEEN /HPF
Bilirubin Urine: NEGATIVE
Glucose, UA: NEGATIVE
Hyaline Cast: NONE SEEN /LPF
Leukocytes, UA: NEGATIVE
Nitrite: NEGATIVE
Specific Gravity, Urine: 1.02 (ref 1.001–1.03)
Squamous Epithelial / LPF: NONE SEEN /HPF (ref <=5)
pH: 5.5 (ref 5.0–8.0)

## 2017-08-04 NOTE — Addendum Note (Signed)
Addended by: Deno Etienne on: 08/04/2017 09:32 AM   Modules accepted: Orders

## 2017-08-17 ENCOUNTER — Telehealth: Payer: Self-pay | Admitting: *Deleted

## 2017-08-17 NOTE — Telephone Encounter (Signed)
Called pt's wife and advised that he will need to collect another urine sample. Will leave collection cup up front for him/her to p/u .Loralee Pacas [place]

## 2017-12-27 DIAGNOSIS — H401190 Primary open-angle glaucoma, unspecified eye, stage unspecified: Secondary | ICD-10-CM | POA: Insufficient documentation

## 2017-12-27 DIAGNOSIS — H25812 Combined forms of 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, left eye: Secondary | ICD-10-CM | POA: Insufficient documentation

## 2017-12-27 DIAGNOSIS — H1820 Unspecified corneal edema: Secondary | ICD-10-CM | POA: Insufficient documentation

## 2018-01-07 ENCOUNTER — Encounter: Payer: 59 | Admitting: Family Medicine

## 2018-01-13 ENCOUNTER — Telehealth: Payer: Self-pay

## 2018-01-13 DIAGNOSIS — E782 Mixed hyperlipidemia: Secondary | ICD-10-CM

## 2018-01-13 DIAGNOSIS — I10 Essential (primary) hypertension: Secondary | ICD-10-CM

## 2018-01-13 DIAGNOSIS — R7301 Impaired fasting glucose: Secondary | ICD-10-CM

## 2018-01-13 DIAGNOSIS — Z125 Encounter for screening for malignant neoplasm of prostate: Secondary | ICD-10-CM

## 2018-01-13 NOTE — Telephone Encounter (Signed)
Labs ordered.

## 2018-01-18 ENCOUNTER — Encounter: Payer: 59 | Admitting: Family Medicine

## 2018-01-19 ENCOUNTER — Telehealth: Payer: Self-pay | Admitting: Family Medicine

## 2018-01-19 ENCOUNTER — Other Ambulatory Visit: Payer: Self-pay | Admitting: *Deleted

## 2018-01-19 DIAGNOSIS — R7989 Other specified abnormal findings of blood chemistry: Secondary | ICD-10-CM

## 2018-01-19 LAB — COMPLETE METABOLIC PANEL WITH GFR
AG Ratio: 1.7 (calc) (ref 1.0–2.5)
ALT: 14 U/L (ref 9–46)
AST: 16 U/L (ref 10–35)
Albumin: 3.7 g/dL (ref 3.6–5.1)
Alkaline phosphatase (APISO): 36 U/L — ABNORMAL LOW (ref 40–115)
BUN/Creatinine Ratio: 18 (calc) (ref 6–22)
BUN: 46 mg/dL — ABNORMAL HIGH (ref 7–25)
CO2: 21 mmol/L (ref 20–32)
Calcium: 8.6 mg/dL (ref 8.6–10.3)
Chloride: 109 mmol/L (ref 98–110)
Creat: 2.62 mg/dL — ABNORMAL HIGH (ref 0.70–1.25)
GFR, Est African American: 29 mL/min/{1.73_m2} — ABNORMAL LOW (ref >=60)
GFR, Est Non African American: 25 mL/min/{1.73_m2} — ABNORMAL LOW (ref >=60)
Globulin: 2.2 g/dL (calc) (ref 1.9–3.7)
Glucose, Bld: 122 mg/dL — ABNORMAL HIGH (ref 65–99)
Potassium: 4.8 mmol/L (ref 3.5–5.3)
Sodium: 139 mmol/L (ref 135–146)
Total Bilirubin: 0.5 mg/dL (ref 0.2–1.2)
Total Protein: 5.9 g/dL — ABNORMAL LOW (ref 6.1–8.1)

## 2018-01-19 LAB — CBC WITH DIFFERENTIAL/PLATELET
Basophils Absolute: 77 cells/uL (ref 0–200)
Basophils Relative: 0.9 %
Eosinophils Absolute: 404 cells/uL (ref 15–500)
Eosinophils Relative: 4.7 %
HCT: 33.2 % — ABNORMAL LOW (ref 38.5–50.0)
Hemoglobin: 11.5 g/dL — ABNORMAL LOW (ref 13.2–17.1)
Lymphs Abs: 1694 cells/uL (ref 850–3900)
MCH: 31.2 pg (ref 27.0–33.0)
MCHC: 34.6 g/dL (ref 32.0–36.0)
MCV: 90 fL (ref 80.0–100.0)
MPV: 9.7 fL (ref 7.5–12.5)
Monocytes Relative: 7.9 %
Neutro Abs: 5745 cells/uL (ref 1500–7800)
Neutrophils Relative %: 66.8 %
Platelets: 252 10*3/uL (ref 140–400)
RBC: 3.69 10*6/uL — ABNORMAL LOW (ref 4.20–5.80)
RDW: 12.4 % (ref 11.0–15.0)
Total Lymphocyte: 19.7 %
WBC mixed population: 679 cells/uL (ref 200–950)
WBC: 8.6 10*3/uL (ref 3.8–10.8)

## 2018-01-19 LAB — B12 AND FOLATE PANEL
Folate: 10.6 ng/mL
Vitamin B-12: 414 pg/mL (ref 200–1100)

## 2018-01-19 LAB — TEST AUTHORIZATION

## 2018-01-19 LAB — LIPID PANEL W/REFLEX DIRECT LDL
Cholesterol: 165 mg/dL (ref <200)
HDL: 30 mg/dL — ABNORMAL LOW (ref >40)
LDL Cholesterol (Calc): 85 mg/dL (calc)
Non-HDL Cholesterol (Calc): 135 mg/dL (calc) — ABNORMAL HIGH (ref <130)
Total CHOL/HDL Ratio: 5.5 (calc) — ABNORMAL HIGH (ref <5.0)
Triglycerides: 373 mg/dL — ABNORMAL HIGH (ref <150)

## 2018-01-19 LAB — HEMOGLOBIN A1C
Hgb A1c MFr Bld: 5.5 % of total Hgb (ref <5.7)
Mean Plasma Glucose: 111 (calc)
eAG (mmol/L): 6.2 (calc)

## 2018-01-19 LAB — FERRITIN: Ferritin: 434 ng/mL — ABNORMAL HIGH (ref 20–380)

## 2018-01-19 LAB — PSA: PSA: 2.9 ng/mL (ref <=4.0)

## 2018-01-19 MED ORDER — AZELASTINE HCL 0.15 % NA SOLN: NASAL | 5 refills | Status: DC

## 2018-01-19 NOTE — Telephone Encounter (Signed)
rx sent.[name], Carl Bleecker [name], CMA

## 2018-01-19 NOTE — Telephone Encounter (Signed)
rx sent to pharmacy. Pt's wife notified.[name], Reet Scharrer [name], CMA

## 2018-01-19 NOTE — Telephone Encounter (Signed)
Patient's wife came in to request that Dr. Linford ArnoldMetheney call in a prescription for Azelastine HCl Nasal Solution 0.15%. Patient's wife stated that they do not trust the other doctor that was prescribing it and will not be seeing him again. Please advise. Thanks!

## 2018-01-31 ENCOUNTER — Encounter: Payer: Self-pay | Admitting: Family Medicine

## 2018-01-31 ENCOUNTER — Ambulatory Visit (INDEPENDENT_AMBULATORY_CARE_PROVIDER_SITE_OTHER): Payer: 59 | Admitting: Family Medicine

## 2018-01-31 ENCOUNTER — Telehealth: Payer: Self-pay | Admitting: *Deleted

## 2018-01-31 VITALS — BP 123/79 | HR 58 | Ht 65.0 in | Wt 161.0 lb

## 2018-01-31 DIAGNOSIS — R351 Nocturia: Secondary | ICD-10-CM

## 2018-01-31 DIAGNOSIS — D649 Anemia, unspecified: Secondary | ICD-10-CM

## 2018-01-31 DIAGNOSIS — R7989 Other specified abnormal findings of blood chemistry: Secondary | ICD-10-CM | POA: Diagnosis not present

## 2018-01-31 DIAGNOSIS — Z0000 Encounter for general adult medical examination without abnormal findings: Secondary | ICD-10-CM

## 2018-01-31 DIAGNOSIS — R319 Hematuria, unspecified: Secondary | ICD-10-CM | POA: Diagnosis not present

## 2018-01-31 LAB — POCT URINALYSIS DIPSTICK
Bilirubin, UA: NEGATIVE
Glucose, UA: NEGATIVE
Ketones, UA: NEGATIVE
Leukocytes, UA: NEGATIVE
Nitrite, UA: NEGATIVE
Protein, UA: >=300
Spec Grav, UA: 1.015 (ref 1.010–1.025)
Urobilinogen, UA: 0.2 E.U./dL
pH, UA: 6.5 (ref 5.0–8.0)

## 2018-01-31 NOTE — Progress Notes (Signed)
Subjective:    Patient ID: Christopher Gregory, male    DOB: September 25, 1956, 62 y.o.   MRN: [id number]  HPI 62 year old male is here today for complete physical exam.  He did get his blood work done ahead of time and there were a couple of concerns.  His BUN and creatinine were very elevated on labs.  In fact I have encouraged him to come in and have it checked in 1 week.  He did go for lab work today.  He did let me know that he gets in the sauna for about 20 minutes 3 days/week.  He typically drinks about 40 ounces before he gets in the sauna but he had just gone to the sauna and then had his blood work drawn about an hour later 13 days ago.  And then today before he went for blood work he was in the sauna again.    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  Though he is getting up frequently at night to urinate and recently decided to start a supplement to help with this and to help cleanse his kidneys.  I saw him last 08-07-2023 he had passed a kidney stone a few months prior but had some blood on his urine on his DOT physical.  Urinalysis here in the office showed 10-20 red blood cells.  We had asked him to come back in for a second sample to confirm the diagnosis and he never returned for the sample.  He recently started some supplements to help with his kidney function as well as he is been taking baking soda to try to help increase the alkaline a T of his blood.  He was also recently diagnosed with glaucoma and is also recently started to Jordan oil.  He also noted to have a hemoglobin of 11.5 on his recent CBC.  Review of Systems  Comprehensive review of systems is negative except for HPI.  BP 123/79   Pulse (!) 58   Ht 5' 5"  (1.651 m)   Wt 161 lb (73 kg)   SpO2 100%   BMI 26.79 kg/m     Allergies  Allergen Reactions  . Ace Inhibitors Other (See Comments)    Allergic reaction  .  Amlodipine     Swelling  . Neomycin     Hives  . Erythromycin Rash    Past Medical History:  Diagnosis Date  . Gout   . Hypertension     Past Surgical History:  Procedure Laterality Date  . KNEE ARTHROSCOPY    . TOTAL HIP ARTHROPLASTY  2000  . TOTAL HIP ARTHROPLASTY  2004    Social History   Socioeconomic History  . Marital status: Married    Spouse name: Katharine Look  . Number of children: Not on file  . Years of education: Not on file  . Highest education level: Not on file  Occupational History  . 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 Financial resource strain: Not on file  . Food insecurity:    Worry: Not on file    Inability: Not on file  . Transportation needs:    Medical: Not on file    Non-medical: Not on file  Tobacco Use  . Smoking status: Former Smoker    Types: Cigarettes  Last attempt to quit: 11/26/1973    Years since quitting: 44.2  . Smokeless tobacco: Never Used  Substance and Sexual Activity  . Alcohol use: No  . Drug use: No  . Sexual activity: Yes    Partners: Female  Lifestyle  . Physical activity:    Days per week: Not on file    Minutes per session: Not on file  . Stress: Not on file  Relationships  . Social connections:    Talks on phone: Not on file    Gets together: Not on file    Attends religious service: Not on file    Active member of club or organization: Not on file    Attends meetings of clubs or organizations: Not on file    Relationship status: Not on file  . Intimate partner violence:    Fear of current or ex partner: Not on file    Emotionally abused: Not on file    Physically abused: Not on file    Forced sexual activity: Not on file  Other Topics Concern  . Not on file  Social History Narrative   Swimming for exercise.     Family History  Problem Relation Age of Onset  . Hypertension Mother   . Hyperlipidemia Father     Outpatient Encounter  Medications as of 01/31/2018  Medication Sig  . allopurinol (ZYLOPRIM) 100 MG tablet TAKE 1 TABLET (100 MG TOTAL) BY MOUTH DAILY.  Marland Kitchen AMBULATORY NON FORMULARY MEDICATION Take 2 capsules by mouth at bedtime. Medication Name: Albaplex  . AMBULATORY NON FORMULARY MEDICATION Take 2 capsules by mouth daily. Medication Name: tuna omega-3 oil  . AMBULATORY NON FORMULARY MEDICATION Take 1 capsule by mouth 3 (three) times daily. Medication Name: A-C Carbamide  . amLODipine (NORVASC) 5 MG tablet Take 1 tablet (5 mg total) by mouth daily.  Marland Kitchen atenolol (TENORMIN) 25 MG tablet Take 0.5 tablets (12.5 mg total) by mouth daily.  . Azelastine HCl 0.15 % SOLN INHALE 1 SPRAY INTO BOTH NOSTRILS TWICE A DAY  . Black Pepper-Turmeric (TURMERIC COMPLEX/BLACK PEPPER PO) Take 800 mg by mouth 2 (two) times daily.  . cetirizine (ZYRTEC) 10 MG tablet Take 10 mg by mouth daily.  . cholecalciferol (VITAMIN D) 1000 UNITS tablet Take 1,000 Units by mouth daily.  . Coenzyme Q10-Red Yeast Rice (CO Q-10 PLUS RED YEAST RICE PO) Take 2 capsules by mouth daily.  . colchicine (COLCRYS) 0.6 MG tablet Take 1 tablet (0.6 mg total) by mouth as needed.  . doxylamine, Sleep, (UNISOM) 25 MG tablet Take by mouth.  . latanoprost (XALATAN) 0.005 % ophthalmic solution 1 drop at bedtime.  Marland Kitchen spironolactone (ALDACTONE) 25 MG tablet Take 1 tablet (25 mg total) by mouth daily.  . timolol (BETIMOL) 0.25 % ophthalmic solution 1-2 drops 2 (two) times daily.  . [DISCONTINUED] dorzolamide (TRUSOPT) 2 % ophthalmic solution Place 1 drop into both eyes 2 (two) times daily.   No facility-administered encounter medications on file as of 01/31/2018.        Objective:   Physical Exam  Constitutional: He is oriented to person, place, and time. He appears well-developed and well-nourished.  HENT:  Head: Normocephalic and atraumatic.  Right Ear: External ear normal.  Left Ear: External ear normal.  Nose: Nose normal.  Mouth/Throat: Oropharynx is clear and  moist.  Eyes: Pupils are equal, round, and reactive to light. Conjunctivae and EOM are normal.  Neck: Normal range of motion. Neck supple. No thyromegaly present.  Cardiovascular: Normal rate, regular  rhythm, normal heart sounds and intact distal pulses.  Pulmonary/Chest: Effort normal and breath sounds normal.  Abdominal: Soft. Bowel sounds are normal. He exhibits no distension and no mass. There is no tenderness. There is no rebound and no guarding.  Musculoskeletal: Normal range of motion.  Lymphadenopathy:    He has no cervical adenopathy.  Neurological: He is alert and oriented to person, place, and time. He has normal reflexes.  Skin: Skin is warm and dry.  Psychiatric: He has a normal mood and affect. His behavior is normal. Judgment and thought content normal.       Assessment & Plan:  CPE Keep up a regular exercise program and make sure you are eating a healthy diet Try to eat 4 servings of dairy a day, or if you are lactose intolerant take a calcium with vitamin D daily.  Your vaccines are up to date.   Abnormal renal function-he Artie went for labs today to recheck renal function.  He looks very dry on blood work so hopefully this was just some mild dehydration and it goes back to baseline.  I am concerned because a urinalysis today did reveal some hematuria.  We will send for microscopic review.    Anemia/low hemoglobin-hemoglobin was 11.5.  Not sure if this is from chronic blood loss from his kidneys or from the bowel.  Given stool cards today to do a sample.  Nocturia-explained that this can be from fluid loading in the evenings and/or from an enlarged prostate gland which particularly in men will cause urinary frequency at night.  Hematuria-re-collected sample today and will send for microscopic review.  Will call with results once available.

## 2018-01-31 NOTE — Telephone Encounter (Signed)
Wellness forms faxed,scanned,confirmation received .[name], Aroura Vasudevan [name], CMA

## 2018-01-31 NOTE — Patient Instructions (Signed)

## 2018-02-01 ENCOUNTER — Telehealth: Payer: Self-pay | Admitting: Family Medicine

## 2018-02-01 LAB — BASIC METABOLIC PANEL WITH GFR
BUN/Creatinine Ratio: 16 (calc) (ref 6–22)
BUN: 53 mg/dL — ABNORMAL HIGH (ref 7–25)
CO2: 25 mmol/L (ref 20–32)
Calcium: 8.6 mg/dL (ref 8.6–10.3)
Chloride: 102 mmol/L (ref 98–110)
Creat: 3.25 mg/dL — ABNORMAL HIGH (ref 0.70–1.25)
GFR, Est African American: 22 mL/min/{1.73_m2} — ABNORMAL LOW (ref >=60)
GFR, Est Non African American: 19 mL/min/{1.73_m2} — ABNORMAL LOW (ref >=60)
Glucose, Bld: 95 mg/dL (ref 65–99)
Potassium: 4.7 mmol/L (ref 3.5–5.3)
Sodium: 137 mmol/L (ref 135–146)

## 2018-02-01 LAB — URINALYSIS, MICROSCOPIC ONLY
Bacteria, UA: NONE SEEN /HPF
Hyaline Cast: NONE SEEN /LPF
RBC / HPF: >=60 /HPF — AB (ref 0–2)
Squamous Epithelial / HPF: NONE SEEN /HPF

## 2018-02-01 NOTE — Telephone Encounter (Signed)
Christopher Gregory has spoken w/pt's wife.[name], [name] [name], CMA

## 2018-02-01 NOTE — Telephone Encounter (Signed)
Christopher Gregory called to say that Christopher Gregory had returned your phone call. He would like you to call him back on his cell. [phone].

## 2018-02-01 NOTE — Addendum Note (Signed)
Addended by: Nani GasserMETHENEY, Vester Balthazor D on: 02/01/2018 08:10 AM   Modules accepted: Orders, SmartSet

## 2018-02-04 ENCOUNTER — Encounter: Payer: Self-pay | Admitting: Family Medicine

## 2018-02-04 ENCOUNTER — Ambulatory Visit (INDEPENDENT_AMBULATORY_CARE_PROVIDER_SITE_OTHER): Payer: 59

## 2018-02-04 DIAGNOSIS — R319 Hematuria, unspecified: Secondary | ICD-10-CM

## 2018-02-04 DIAGNOSIS — N200 Calculus of kidney: Secondary | ICD-10-CM | POA: Diagnosis not present

## 2018-02-04 LAB — HEMOCCULT SLIDES (X 3 CARDS)
OCCULT 1: NEGATIVE
OCCULT 2: NEGATIVE
OCCULT 3: NEGATIVE

## 2018-02-10 ENCOUNTER — Telehealth: Payer: Self-pay | Admitting: Family Medicine

## 2018-02-10 DIAGNOSIS — M317 Microscopic polyangiitis: Secondary | ICD-10-CM | POA: Insufficient documentation

## 2018-02-10 DIAGNOSIS — N179 Acute kidney failure, unspecified: Secondary | ICD-10-CM | POA: Insufficient documentation

## 2018-02-10 NOTE — Telephone Encounter (Signed)
Pts wife called crying and wanted Dr.Metheney and Tonya to know that he has been hospitalized at [facility] and they are going to Biopsy Kidney and said he may need Dialysis. She was just wanting you both to know because Dr.Metheney was concerned

## 2018-02-11 MED ORDER — DIPHENOXYLATE-ATROPINE 2.5-0.025 MG PO TABS
ORAL_TABLET | ORAL | Status: DC
Start: ? — End: 2018-02-11

## 2018-02-11 MED ORDER — GUAIFENESIN 100 MG/5ML PO LIQD
200.00 | ORAL | Status: DC
Start: ? — End: 2018-02-11

## 2018-02-11 MED ORDER — PROMETHAZINE HCL 25 MG PO TABS
12.50 | ORAL_TABLET | ORAL | Status: DC
Start: ? — End: 2018-02-11

## 2018-02-11 MED ORDER — NITROGLYCERIN 0.4 MG SL SUBL
.40 | SUBLINGUAL_TABLET | SUBLINGUAL | Status: DC
Start: ? — End: 2018-02-11

## 2018-02-11 MED ORDER — ACETAMINOPHEN 325 MG PO TABS
650.00 | ORAL_TABLET | ORAL | Status: DC
Start: ? — End: 2018-02-11

## 2018-02-11 MED ORDER — ONDANSETRON HCL 4 MG PO TABS
4.00 | ORAL_TABLET | ORAL | Status: DC
Start: ? — End: 2018-02-11

## 2018-02-11 MED ORDER — ACETAMINOPHEN 650 MG RE SUPP
650.00 | RECTAL | Status: DC
Start: ? — End: 2018-02-11

## 2018-02-11 MED ORDER — GENERIC EXTERNAL MEDICATION
Status: DC
Start: ? — End: 2018-02-11

## 2018-02-11 MED ORDER — NETARSUDIL DIMESYLATE 0.02 % OP SOLN
OPHTHALMIC | Status: DC
Start: 2018-02-11 — End: 2018-02-11

## 2018-02-11 MED ORDER — ONDANSETRON HCL 4 MG/2ML IJ SOLN
4.00 | INTRAMUSCULAR | Status: DC
Start: ? — End: 2018-02-11

## 2018-02-11 MED ORDER — CALCIUM CARBONATE ANTACID 500 MG PO CHEW
1000.00 | CHEWABLE_TABLET | ORAL | Status: DC
Start: ? — End: 2018-02-11

## 2018-02-11 MED ORDER — AMLODIPINE BESYLATE 5 MG PO TABS
5.00 | ORAL_TABLET | ORAL | Status: DC
Start: 2018-02-11 — End: 2018-02-11

## 2018-02-11 MED ORDER — TIMOLOL MALEATE 0.25 % OP SOLN
OPHTHALMIC | Status: DC
Start: 2018-02-11 — End: 2018-02-11

## 2018-02-11 MED ORDER — LATANOPROST 0.005 % OP SOLN
OPHTHALMIC | Status: DC
Start: 2018-02-11 — End: 2018-02-11

## 2018-02-11 MED ORDER — CLONIDINE HCL 0.1 MG PO TABS
.10 | ORAL_TABLET | ORAL | Status: DC
Start: ? — End: 2018-02-11

## 2018-02-11 MED ORDER — HYDROCODONE-ACETAMINOPHEN 5-325 MG PO TABS
ORAL_TABLET | ORAL | Status: DC
Start: ? — End: 2018-02-11

## 2018-02-11 MED ORDER — FAMOTIDINE 20 MG PO TABS
20.00 | ORAL_TABLET | ORAL | Status: DC
Start: ? — End: 2018-02-11

## 2018-02-11 MED ORDER — DIPHENHYDRAMINE HCL 25 MG PO CAPS
25.00 | ORAL_CAPSULE | ORAL | Status: DC
Start: ? — End: 2018-02-11

## 2018-02-11 MED ORDER — LACTULOSE 10 GM/15ML PO SOLN
20.00 | ORAL | Status: DC
Start: ? — End: 2018-02-11

## 2018-02-11 MED ORDER — ATENOLOL 25 MG PO TABS
12.50 | ORAL_TABLET | ORAL | Status: DC
Start: 2018-02-11 — End: 2018-02-11

## 2018-02-11 MED ORDER — MORPHINE SULFATE (PF) 4 MG/ML IV SOLN
2.00 | INTRAVENOUS | Status: DC
Start: ? — End: 2018-02-11

## 2018-02-11 NOTE — Telephone Encounter (Signed)
Called and spoke with Dois DavenportSandra thsi morning. He is schedule for bx around 1 today and hopefully will get to go home before the weekend.

## 2018-02-11 NOTE — Telephone Encounter (Signed)
Pt's wife left VM on clinical triage line stating they went ahead and took Pt back for the biopsy now instead of waiting until 1. He may be able to come home this afternoon.

## 2018-02-15 MED ORDER — ONDANSETRON HCL 4 MG/2ML IJ SOLN
4.00 | INTRAMUSCULAR | Status: DC
Start: ? — End: 2018-02-15

## 2018-02-15 MED ORDER — ATENOLOL 25 MG PO TABS
12.50 | ORAL_TABLET | ORAL | Status: DC
Start: 2018-02-16 — End: 2018-02-15

## 2018-02-15 MED ORDER — NITROGLYCERIN 0.4 MG SL SUBL
0.40 | SUBLINGUAL_TABLET | SUBLINGUAL | Status: DC
Start: ? — End: 2018-02-15

## 2018-02-15 MED ORDER — SODIUM CHLORIDE 0.9 % IV SOLN
INTRAVENOUS | Status: DC
Start: ? — End: 2018-02-15

## 2018-02-15 MED ORDER — DIPHENOXYLATE-ATROPINE 2.5-0.025 MG PO TABS
ORAL_TABLET | ORAL | Status: DC
Start: ? — End: 2018-02-15

## 2018-02-15 MED ORDER — AZELASTINE HCL 0.1 % NA SOLN
NASAL | Status: DC
Start: 2018-02-16 — End: 2018-02-15

## 2018-02-15 MED ORDER — LORATADINE 10 MG PO TABS
10.00 | ORAL_TABLET | ORAL | Status: DC
Start: 2018-02-16 — End: 2018-02-15

## 2018-02-15 MED ORDER — MORPHINE SULFATE (PF) 4 MG/ML IV SOLN
2.00 | INTRAVENOUS | Status: DC
Start: ? — End: 2018-02-15

## 2018-02-15 MED ORDER — GUAIFENESIN 100 MG/5ML PO LIQD
200.00 | ORAL | Status: DC
Start: ? — End: 2018-02-15

## 2018-02-15 MED ORDER — GENERIC EXTERNAL MEDICATION
Status: DC
Start: ? — End: 2018-02-15

## 2018-02-15 MED ORDER — HYDROCODONE-ACETAMINOPHEN 5-325 MG PO TABS
ORAL_TABLET | ORAL | Status: DC
Start: ? — End: 2018-02-15

## 2018-02-15 MED ORDER — CLONIDINE HCL 0.1 MG PO TABS
.10 | ORAL_TABLET | ORAL | Status: DC
Start: ? — End: 2018-02-15

## 2018-02-15 MED ORDER — PROMETHAZINE HCL 25 MG PO TABS
12.50 | ORAL_TABLET | ORAL | Status: DC
Start: ? — End: 2018-02-15

## 2018-02-15 MED ORDER — TIMOLOL MALEATE 0.25 % OP SOLN
OPHTHALMIC | Status: DC
Start: 2018-02-16 — End: 2018-02-15

## 2018-02-15 MED ORDER — LACTULOSE 10 GM/15ML PO SOLN
20.00 | ORAL | Status: DC
Start: ? — End: 2018-02-15

## 2018-02-15 MED ORDER — CALCIUM CARBONATE ANTACID 500 MG PO CHEW
1000.00 | CHEWABLE_TABLET | ORAL | Status: DC
Start: ? — End: 2018-02-15

## 2018-02-15 MED ORDER — GENERIC EXTERNAL MEDICATION
1000.00 | Status: DC
Start: 2018-02-16 — End: 2018-02-15

## 2018-02-15 MED ORDER — ONDANSETRON HCL 4 MG PO TABS
4.00 | ORAL_TABLET | ORAL | Status: DC
Start: ? — End: 2018-02-15

## 2018-02-15 MED ORDER — ACETAMINOPHEN 325 MG PO TABS
650.00 | ORAL_TABLET | ORAL | Status: DC
Start: ? — End: 2018-02-15

## 2018-02-15 MED ORDER — ACETAMINOPHEN 650 MG RE SUPP
650.00 | RECTAL | Status: DC
Start: ? — End: 2018-02-15

## 2018-02-15 MED ORDER — NETARSUDIL DIMESYLATE 0.02 % OP SOLN
OPHTHALMIC | Status: DC
Start: 2018-02-16 — End: 2018-02-15

## 2018-02-15 MED ORDER — LATANOPROST 0.005 % OP SOLN
OPHTHALMIC | Status: DC
Start: 2018-02-16 — End: 2018-02-15

## 2018-02-15 MED ORDER — FAMOTIDINE 20 MG PO TABS
20.00 | ORAL_TABLET | ORAL | Status: DC
Start: ? — End: 2018-02-15

## 2018-02-15 MED ORDER — DIPHENHYDRAMINE HCL 25 MG PO CAPS
25.00 | ORAL_CAPSULE | ORAL | Status: DC
Start: ? — End: 2018-02-15

## 2018-02-15 NOTE — Telephone Encounter (Signed)
Jeson's wife called and states he will be in the hospital for 3 more days.

## 2018-02-21 ENCOUNTER — Telehealth: Payer: Self-pay

## 2018-02-21 DIAGNOSIS — R319 Hematuria, unspecified: Secondary | ICD-10-CM

## 2018-02-21 DIAGNOSIS — R7989 Other specified abnormal findings of blood chemistry: Secondary | ICD-10-CM

## 2018-02-21 NOTE — Telephone Encounter (Signed)
Spoke w/pt's wife and she stated that it's not so much as that they are not happy with Dr. Bland SpanHadley it was just that some of the treatment processes were off such as going to have labs done and the orders not being there and he had taken time off of work, medication issues not being fully addressed, and stating that if he didn't do what was told he would die.  I told his wife that I would place referral for another doctor if that is what they wanted. She stated this would work [place].  Referral placed.[name], Antwuan Eckley [name], CMA

## 2018-02-21 NOTE — Telephone Encounter (Signed)
PT wife came in and asked if we can refer [place]. [place] upset the pt and his wife with "scare tactics".

## 2018-02-22 ENCOUNTER — Telehealth: Payer: Self-pay

## 2018-02-22 NOTE — Telephone Encounter (Signed)
Called pt's wife and lvm informed her that she can let them know this when they call to let their office know that he will need a morning appt. Will let Memory ArgueCindy F know to advise the office that she is sending the referral to also.[name], Gergory Biello [name], CMA

## 2018-02-23 ENCOUNTER — Other Ambulatory Visit: Payer: Self-pay | Admitting: Family Medicine

## 2018-02-28 DIAGNOSIS — N058 Unspecified nephritic syndrome with other morphologic changes: Secondary | ICD-10-CM | POA: Insufficient documentation

## 2018-03-11 ENCOUNTER — Encounter: Payer: Self-pay | Admitting: Family Medicine

## 2018-03-11 ENCOUNTER — Ambulatory Visit: Payer: 59 | Admitting: Family Medicine

## 2018-03-11 VITALS — BP 162/72 | HR 62 | Temp 98.2°F | Ht 65.0 in | Wt 165.0 lb

## 2018-03-11 DIAGNOSIS — R05 Cough: Secondary | ICD-10-CM

## 2018-03-11 DIAGNOSIS — R059 Cough, unspecified: Secondary | ICD-10-CM

## 2018-03-11 DIAGNOSIS — H918X1 Other specified hearing loss, right ear: Secondary | ICD-10-CM

## 2018-03-11 MED ORDER — HYDROCODONE-HOMATROPINE 5-1.5 MG/5ML PO SYRP
5.0000 mL | ORAL_SOLUTION | Freq: Every evening | ORAL | 0 refills | Status: DC | PRN
Start: 2018-03-11 — End: 2018-06-27

## 2018-03-11 MED ORDER — ALBUTEROL SULFATE (2.5 MG/3ML) 0.083% IN NEBU: 2.5000 mg | INHALATION_SOLUTION | Freq: Four times a day (QID) | RESPIRATORY_TRACT | 1 refills | Status: DC | PRN

## 2018-03-11 MED ORDER — BENZONATATE 200 MG PO CAPS: 200.0000 mg | ORAL_CAPSULE | Freq: Two times a day (BID) | ORAL | 0 refills | Status: DC | PRN

## 2018-03-11 NOTE — Progress Notes (Signed)
Subjective:    Patient ID: Christopher Gregory, male    DOB: [birth year], 62 y.o.   MRN: [id number]  HPI 63 yo male c/o of cough that started a couple of weeks ago.  He has been on rituximab treatments for recent new diagnosis of autoimmune type nephrotic syndrome, microscopic polyangiitis.  He knows that this is potentially 1 of the side effects of the drug.  He does feels like there is something in his lungs.  He said he noticed it immediately after his first infusion.  He noticed a similar sensation after the second infusion but it just was not quite as intense.  But he noticed after the second infusion that he was feeling short of breath at the gym.  He denies any fever chills or sweats.  He is been using NyQuil but it does not really seem to help.  He is also been using Vicks VapoRub which has seemed to help some.  The cough is definitely worse at night. He has had a chest xray on 5/1 that is normal. Sputum is now clear, ws green. Hears wheezing when he lays down.  He I son prednisone '40mg'$  daily and on BActrim 3 days per week.  He is on furosemide for swelling.    He also complains of some hearing loss in the right ear and wonders if that could be from the medication treatment as well.  His wife who is here with him today reports that she is noticed some gradual hearing loss on that side even before infusions but feel like it has gotten significantly worse.   Review of Systems  Ht '5\' 5"'$  (1.651 m)   BMI 26.79 kg/m     Allergies  Allergen Reactions  . Ace Inhibitors Other (See Comments)    Allergic reaction  . Amlodipine     Swelling  . Neomycin     Hives  . Erythromycin Rash    Past Medical History:  Diagnosis Date  . Gout   . Hypertension     Past Surgical History:  Procedure Laterality Date  . KNEE ARTHROSCOPY    . TOTAL HIP ARTHROPLASTY  2000  . TOTAL HIP ARTHROPLASTY  2004    Social History   Socioeconomic History  . Marital status: Married    Spouse name: Katharine Look  .  Number of children: Not on file  . Years of education: Not on file  . Highest education level: Not on file  Occupational History  . 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 Financial resource strain: Not on file  . Food insecurity:    Worry: Not on file    Inability: Not on file  . Transportation needs:    Medical: Not on file    Non-medical: Not on file  Tobacco Use  . Smoking status: Former Smoker    Types: Cigarettes    Last attempt to quit: 11/26/1973    Years since quitting: 44.3  . Smokeless tobacco: Never Used  Substance and Sexual Activity  . Alcohol use: No  . Drug use: No  . Sexual activity: Yes    Partners: Female  Lifestyle  . Physical activity:    Days per week: Not on file    Minutes per session: Not on file  . Stress: Not on file  Relationships  . Social connections:    Talks on phone: Not on file    Gets together: Not on  file    Attends religious service: Not on file    Active member of club or organization: Not on file    Attends meetings of clubs or organizations: Not on file    Relationship status: Not on file  . Intimate partner violence:    Fear of current or ex partner: Not on file    Emotionally abused: Not on file    Physically abused: Not on file    Forced sexual activity: Not on file  Other Topics Concern  . Not on file  Social History Narrative   Swimming for exercise.     Family History  Problem Relation Age of Onset  . Hypertension Mother   . Hyperlipidemia Father     Outpatient Encounter Medications as of 03/11/2018  Medication Sig  . AMBULATORY NON FORMULARY MEDICATION Take 2 capsules by mouth daily. Medication Name: tuna omega-3 oil  . amLODipine (NORVASC) 5 MG tablet Take 1 tablet (5 mg total) by mouth daily.  Marland Kitchen atenolol (TENORMIN) 25 MG tablet TAKE 1/2 TABLET BY MOUTH DAILY  . Azelastine HCl 0.15 % SOLN INHALE 1 SPRAY INTO BOTH NOSTRILS TWICE A DAY  . Black Pepper-Turmeric  (TURMERIC COMPLEX/BLACK PEPPER PO) Take 800 mg by mouth 2 (two) times daily.  . cetirizine (ZYRTEC) 10 MG tablet Take 10 mg by mouth daily.  . cholecalciferol (VITAMIN D) 1000 UNITS tablet Take 1,000 Units by mouth daily.  . Coenzyme Q10-Red Yeast Rice (CO Q-10 PLUS RED YEAST RICE PO) Take 2 capsules by mouth daily.  . colchicine (COLCRYS) 0.6 MG tablet Take 1 tablet (0.6 mg total) by mouth as needed.  . doxylamine, Sleep, (UNISOM) 25 MG tablet Take by mouth.  . furosemide (LASIX) 20 MG tablet Take 20 mg by mouth daily.  Marland Kitchen latanoprost (XALATAN) 0.005 % ophthalmic solution 1 drop at bedtime.  Marland Kitchen omeprazole (PRILOSEC) 20 MG capsule Take 20 mg by mouth daily.  . predniSONE (DELTASONE) 10 MG tablet Take 10 mg by mouth 4 (four) times daily.  Marland Kitchen sulfamethoxazole-trimethoprim (BACTRIM DS,SEPTRA DS) 800-160 MG tablet Take 1 tablet by mouth every Monday, Wednesday, and Friday.  . timolol (BETIMOL) 0.25 % ophthalmic solution 1-2 drops 2 (two) times daily.  Marland Kitchen albuterol (PROVENTIL) (2.5 MG/3ML) 0.083% nebulizer solution Take 3 mLs (2.5 mg total) by nebulization every 6 (six) hours as needed for wheezing or shortness of breath.  . benzonatate (TESSALON) 200 MG capsule Take 1 capsule (200 mg total) by mouth 2 (two) times daily as needed for cough.  Marland Kitchen HYDROcodone-homatropine (HYCODAN) 5-1.5 MG/5ML syrup Take 5 mLs by mouth at bedtime as needed for cough.  . [DISCONTINUED] allopurinol (ZYLOPRIM) 100 MG tablet TAKE 1 TABLET (100 MG TOTAL) BY MOUTH DAILY.  . [DISCONTINUED] AMBULATORY NON FORMULARY MEDICATION Take 2 capsules by mouth at bedtime. Medication Name: Albaplex  . [DISCONTINUED] AMBULATORY NON FORMULARY MEDICATION Take 1 capsule by mouth 3 (three) times daily. Medication Name: A-C Carbamide  . [DISCONTINUED] spironolactone (ALDACTONE) 25 MG tablet Take 1 tablet (25 mg total) by mouth daily.   No facility-administered encounter medications on file as of 03/11/2018.          Objective:   Physical Exam   Constitutional: He is oriented to person, place, and time. He appears well-developed and well-nourished.  HENT:  Head: Normocephalic and atraumatic.  Right Ear: External ear normal.  Left Ear: External ear normal.  Nose: Nose normal.  Mouth/Throat: Oropharynx is clear and moist.  TMs and canals are clear.   Eyes: Pupils are  equal, round, and reactive to light. Conjunctivae and EOM are normal.  Neck: Neck supple. No thyromegaly present.  Cardiovascular: Normal rate and normal heart sounds.  Pulmonary/Chest: Effort normal and breath sounds normal.  Lymphadenopathy:    He has no cervical adenopathy.  Neurological: He is alert and oriented to person, place, and time.  Skin: Skin is warm and dry.  Psychiatric: He has a normal mood and affect.        Assessment & Plan:  Cough-likely secondary to rituximab treatments.  He feels like as long as the symptoms can be somewhat controlled he will continue any treatments that he may need.  We discussed options.  We will try Tessalon Perles during the daytime and hydrocodone cough syrup at night.  Rituximab can also cause pulmonary toxicity so we will need to keep an eye on this right now he have some slight expiratory wheezing on exam but is actually moving air well and pulse ox was normal today.  I am also going to send over an albuterol inhaler for him to try.  If it is helpful then good if it not then will discontinue.  Explained that albuterol really only works well if there is a certain amount of bronchospasm present.  Right ear hearing loss-exam is normal today.  Certainly could refer him to ENT/audiometry for further evaluation.

## 2018-03-14 ENCOUNTER — Encounter: Payer: Self-pay | Admitting: Family Medicine

## 2018-03-14 ENCOUNTER — Telehealth: Payer: Self-pay

## 2018-03-14 MED ORDER — ALBUTEROL SULFATE HFA 108 (90 BASE) MCG/ACT IN AERS
2.0000 | INHALATION_SPRAY | Freq: Four times a day (QID) | RESPIRATORY_TRACT | 0 refills | Status: DC | PRN
Start: 2018-03-14 — End: 2019-07-11

## 2018-03-14 NOTE — Telephone Encounter (Signed)
Left VM for Pt's wife with status update  

## 2018-03-14 NOTE — Telephone Encounter (Signed)
K, new prescription sent.  Please tell them apologize for the air.  I did not realize it was the solution instead of the actual inhaler.

## 2018-03-14 NOTE — Telephone Encounter (Signed)
Christopher Gregory's wife called and states he received a prescription for nebulizer solution instead of inhaler. Please advise.

## 2018-05-12 ENCOUNTER — Other Ambulatory Visit: Payer: Self-pay | Admitting: Family Medicine

## 2018-05-31 LAB — LIPID PANEL
Cholesterol: 281 — AB (ref 0–200)
HDL: 37 (ref 35–70)
LDL Cholesterol: 176
Triglycerides: 536 — AB (ref 40–160)

## 2018-05-31 LAB — BASIC METABOLIC PANEL
BUN: 32 — AB (ref 4–21)
Creatinine: 1.7 — AB (ref 0.6–1.3)
Glucose: 93
Potassium: 4.6 (ref 3.4–5.3)
Sodium: 141 (ref 137–147)

## 2018-06-15 ENCOUNTER — Encounter: Payer: Self-pay | Admitting: Family Medicine

## 2018-06-15 LAB — URIC ACID: Uric Acid: 9.2

## 2018-06-27 ENCOUNTER — Encounter: Payer: Self-pay | Admitting: Family Medicine

## 2018-06-27 ENCOUNTER — Ambulatory Visit (INDEPENDENT_AMBULATORY_CARE_PROVIDER_SITE_OTHER): Payer: 59 | Admitting: Family Medicine

## 2018-06-27 VITALS — BP 134/62 | HR 50 | Ht 65.0 in | Wt 170.0 lb

## 2018-06-27 DIAGNOSIS — E559 Vitamin D deficiency, unspecified: Secondary | ICD-10-CM

## 2018-06-27 DIAGNOSIS — M317 Microscopic polyangiitis: Secondary | ICD-10-CM

## 2018-06-27 DIAGNOSIS — I10 Essential (primary) hypertension: Secondary | ICD-10-CM

## 2018-06-27 DIAGNOSIS — E038 Other specified hypothyroidism: Secondary | ICD-10-CM

## 2018-06-27 MED ORDER — ATENOLOL 25 MG PO TABS: 100.0000 mg | ORAL_TABLET | Freq: Every day | ORAL | 1 refills | Status: DC

## 2018-06-27 MED ORDER — LEVOTHYROXINE SODIUM 50 MCG PO TABS: 50.0000 ug | ORAL_TABLET | Freq: Every day | ORAL | 1 refills | Status: DC

## 2018-06-27 NOTE — Progress Notes (Signed)
   Subjective:    CC: BP  HPI:  Hypertension- Pt denies chest pain, SOB, dizziness, or heart palpitations.  Taking meds as directed w/o problems.  Denies medication side effects.  Now on Atenolol 100mg  daily.    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  It was making him feel really bad and was starting to affect his vision so he decided to discontinue it.  In fact he is been off of it for almost 2 months and he is actually been taking really high doses of vitamin D after his wife did some research online to see that there is some potential benefit in this particular condition.  He would like to have his vitamin D levels rechecked today.  His goal is to get his vitamin D between 80 and 100.  Hypothyroidism-last time he saw his nephrologist in [facility] they increased his levothyroxine from 25 up to 50 mcg.  But he says he had difficulty getting a new prescription so he is actually been taking his wife's tabs.  I did confirm in the notes that he is supposed to be taking 50 mcg based on a TSH right around 25.   Past medical history, Surgical history, Family history not pertinant except as noted below, Social history, Allergies, and medications have been entered into the medical record, reviewed, and corrections made.   Review of Systems: No fevers, chills, night sweats, weight loss, chest pain, or shortness of breath.   Objective:    General: Well Developed, well nourished, and in no acute distress.  Neuro: Alert and oriented x3, extra-ocular muscles intact, sensation grossly intact.  HEENT: Normocephalic, atraumatic  Skin: Warm and dry, no rashes. Cardiac: Regular rate and rhythm, no murmurs rubs or gallops, no lower extremity edema.  Respiratory: Clear to auscultation bilaterally. Not using accessory muscles, speaking in full sentences.   Impression and Recommendations:      HTN - Well controlled. Continue current regimen. Follow up in  6 months.    ANCA glomerulonephritis/microscopic polyangiitis-off of prednisone and taking high-dose vitamin D.  I be happy to recheck his levels but I did encourage him to discuss what he is doing with his nephrologist.  Though it does seem like his numbers are actually improving which is great.  Hypothyroidism-we will go ahead and send over new prescription for 50 mg tabs per we will go ahead and recheck a TSH today make adjustments if needed.  Plan to recheck TSH again in 6 to 8 weeks.

## 2018-06-28 LAB — COMPLETE METABOLIC PANEL WITH GFR
AG Ratio: 1.9 (calc) (ref 1.0–2.5)
ALT: 9 U/L (ref 9–46)
AST: 13 U/L (ref 10–35)
Albumin: 3.7 g/dL (ref 3.6–5.1)
Alkaline phosphatase (APISO): 32 U/L — ABNORMAL LOW (ref 40–115)
BUN/Creatinine Ratio: 21 (calc) (ref 6–22)
BUN: 36 mg/dL — ABNORMAL HIGH (ref 7–25)
CO2: 27 mmol/L (ref 20–32)
Calcium: 9.3 mg/dL (ref 8.6–10.3)
Chloride: 106 mmol/L (ref 98–110)
Creat: 1.7 mg/dL — ABNORMAL HIGH (ref 0.70–1.25)
GFR, Est African American: 48 mL/min/{1.73_m2} — ABNORMAL LOW (ref >=60)
GFR, Est Non African American: 42 mL/min/{1.73_m2} — ABNORMAL LOW (ref >=60)
Globulin: 2 g/dL (calc) (ref 1.9–3.7)
Glucose, Bld: 136 mg/dL — ABNORMAL HIGH (ref 65–99)
Potassium: 4.8 mmol/L (ref 3.5–5.3)
Sodium: 140 mmol/L (ref 135–146)
Total Bilirubin: 0.6 mg/dL (ref 0.2–1.2)
Total Protein: 5.7 g/dL — ABNORMAL LOW (ref 6.1–8.1)

## 2018-06-28 LAB — VITAMIN D 25 HYDROXY (VIT D DEFICIENCY, FRACTURES): Vit D, 25-Hydroxy: 93 ng/mL (ref 30–100)

## 2018-06-28 LAB — TSH: TSH: 6.11 mIU/L — ABNORMAL HIGH (ref 0.40–4.50)

## 2018-06-29 ENCOUNTER — Other Ambulatory Visit: Payer: Self-pay | Admitting: *Deleted

## 2018-06-29 ENCOUNTER — Encounter: Payer: Self-pay | Admitting: Family Medicine

## 2018-06-29 DIAGNOSIS — I10 Essential (primary) hypertension: Secondary | ICD-10-CM

## 2018-06-29 DIAGNOSIS — E559 Vitamin D deficiency, unspecified: Secondary | ICD-10-CM

## 2018-06-29 DIAGNOSIS — E039 Hypothyroidism, unspecified: Secondary | ICD-10-CM | POA: Insufficient documentation

## 2018-07-22 ENCOUNTER — Other Ambulatory Visit: Payer: Self-pay | Admitting: Family Medicine

## 2018-07-24 ENCOUNTER — Other Ambulatory Visit: Payer: Self-pay | Admitting: Family Medicine

## 2018-07-27 NOTE — Telephone Encounter (Signed)
Per [facility] at [facility], this RX was received.  

## 2018-08-03 ENCOUNTER — Other Ambulatory Visit: Payer: Self-pay | Admitting: *Deleted

## 2018-08-03 DIAGNOSIS — E038 Other specified hypothyroidism: Secondary | ICD-10-CM

## 2018-08-03 MED ORDER — LEVOTHYROXINE SODIUM 50 MCG PO TABS: 50.0000 ug | ORAL_TABLET | Freq: Every day | ORAL | 1 refills | Status: DC

## 2018-08-12 ENCOUNTER — Telehealth: Payer: Self-pay | Admitting: Family Medicine

## 2018-08-12 DIAGNOSIS — E039 Hypothyroidism, unspecified: Secondary | ICD-10-CM

## 2018-08-12 NOTE — Telephone Encounter (Signed)
Pt's wife informed. She wanted to let Dr. Linford Arnold know that he wasn't doing the Vit d every other day, but he will start doing that now.Laureen Ochs, Viann Shove, CMA

## 2018-08-12 NOTE — Telephone Encounter (Signed)
Please call pt and tell him thank you for dropping of lab.  I will put in an order for TSH only so he can go sometime this month to recheck his dose on his medication.

## 2018-08-26 ENCOUNTER — Other Ambulatory Visit: Payer: Self-pay | Admitting: *Deleted

## 2018-08-26 LAB — TSH: TSH: 1.17 mIU/L (ref 0.40–4.50)

## 2018-09-01 ENCOUNTER — Other Ambulatory Visit: Payer: Self-pay | Admitting: Family Medicine

## 2018-09-01 MED ORDER — FUROSEMIDE 20 MG PO TABS: 20.0000 mg | ORAL_TABLET | Freq: Every day | ORAL | 5 refills | Status: DC

## 2018-09-01 NOTE — Telephone Encounter (Signed)
OK, sent.

## 2018-09-06 ENCOUNTER — Other Ambulatory Visit: Payer: Self-pay | Admitting: *Deleted

## 2018-09-06 MED ORDER — FUROSEMIDE 20 MG PO TABS: 20.0000 mg | ORAL_TABLET | Freq: Every day | ORAL | 1 refills | Status: DC

## 2018-10-26 ENCOUNTER — Other Ambulatory Visit: Payer: Self-pay | Admitting: Family Medicine

## 2018-12-30 ENCOUNTER — Ambulatory Visit (INDEPENDENT_AMBULATORY_CARE_PROVIDER_SITE_OTHER): Payer: 59 | Admitting: Sports Medicine

## 2018-12-30 ENCOUNTER — Encounter: Payer: Self-pay | Admitting: Sports Medicine

## 2018-12-30 DIAGNOSIS — M722 Plantar fascial fibromatosis: Secondary | ICD-10-CM | POA: Diagnosis not present

## 2018-12-30 NOTE — Assessment & Plan Note (Signed)
Injection as above. Referral out for Rehab exercises given. Return in 1 month.

## 2018-12-30 NOTE — Progress Notes (Signed)
Subjective:    CC: Left foot pain  HPI: Christopher Gregory is a pleasant 62 year old male, he has severe left foot pain, plantar aspect, worse with first few steps in the morning and worse at the end of the day.  Localized on the plantar aspect of the calcaneus without radiation.  Has tried some stretches without any improvement.  He does wear custom orthotics that I made him about 3 years ago.  In addition he has renal disease, vasculitis, and severe 3+ lower extremity pitting edema, has never worn compression socks.  I reviewed the past medical history, family history, social history, surgical history, and allergies today and no changes were needed.  Please see the problem list section below in epic for further details.  Past Medical History: Past Medical History:  Diagnosis Date  . Gout   . Hypertension    Past Surgical History: Past Surgical History:  Procedure Laterality Date  . KNEE ARTHROSCOPY    . TOTAL HIP ARTHROPLASTY  2000  . TOTAL HIP ARTHROPLASTY  2004   Social History: Social History   Socioeconomic History  . Marital status: Married    Spouse name: Katharine Look  . Number of children: Not on file  . Years of education: Not on file  . Highest education level: Not on file  Occupational History  . 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 Financial resource strain: Not on file  . Food insecurity:    Worry: Not on file    Inability: Not on file  . Transportation needs:    Medical: Not on file    Non-medical: Not on file  Tobacco Use  . Smoking status: Former Smoker    Types: Cigarettes    Last attempt to quit: 11/26/1973    Years since quitting: 45.1  . Smokeless tobacco: Never Used  Substance and Sexual Activity  . Alcohol use: No  . Drug use: No  . Sexual activity: Yes    Partners: Female  Lifestyle  . Physical activity:    Days per week: Not on file    Minutes per session: Not on file  . Stress: Not on file    Relationships  . Social connections:    Talks on phone: Not on file    Gets together: Not on file    Attends religious service: Not on file    Active member of club or organization: Not on file    Attends meetings of clubs or organizations: Not on file    Relationship status: Not on file  Other Topics Concern  . Not on file  Social History Narrative   Swimming for exercise.    Family History: Family History  Problem Relation Age of Onset  . Hypertension Mother   . Hyperlipidemia Father    Allergies: Allergies  Allergen Reactions  . Ace Inhibitors Other (See Comments)    Allergic reaction  . Amlodipine     Swelling  . Neomycin     Hives  . Erythromycin Rash   Medications: See med rec.  Review of Systems: No fevers, chills, night sweats, weight loss, chest pain, or shortness of breath.   Objective:    General: Well Developed, well nourished, and in no acute distress.  Neuro: Alert and oriented x3, extra-ocular muscles intact, sensation grossly intact.  HEENT: Normocephalic, atraumatic, pupils equal round reactive to light, neck supple, no masses, no lymphadenopathy, thyroid nonpalpable.  Skin: Warm and dry, no  rashes. Cardiac: Regular rate and rhythm, no murmurs rubs or gallops, no lower extremity edema.  Respiratory: Clear to auscultation bilaterally. Not using accessory muscles, speaking in full sentences. Left foot: No visible erythema. 3+ lower extremity bilateral symmetric pitting edema Range of motion is full in all directions. Strength is 5/5 in all directions. No hallux valgus. No pes cavus or pes planus. No abnormal callus noted. No pain over the navicular prominence, or base of fifth metatarsal. Severe tenderness to palpation of the calcaneal insertion of plantar fascia. No pain at the Achilles insertion. No pain over the calcaneal bursa. No pain of the retrocalcaneal bursa. No tenderness to palpation over the tarsals, metatarsals, or phalanges. No  hallux rigidus or limitus. No tenderness palpation over interphalangeal joints. No pain with compression of the metatarsal heads. Neurovascularly intact distally.  Procedure: Real-time Ultrasound Guided Injection of left plantar fascia origin Device: GE Logiq E  Verbal informed consent obtained.  Time-out conducted.  Noted no overlying erythema, induration, or other signs of local infection.  Skin prepped in a sterile fashion.  Local anesthesia: Topical Ethyl chloride.  With sterile technique and under real time ultrasound guidance: Noted proximal plantar fascial spurring and calcification, 25-gauge needle advanced just deep to the insertion of the plantar fascia into the calcaneus, I injected 1 cc Kenalog 40, 1 cc lidocaine, 1 cc bupivacaine. Completed without difficulty  Pain immediately resolved suggesting accurate placement of the medication.  Advised to call if fevers/chills, erythema, induration, drainage, or persistent bleeding.  Images permanently stored and available for review in the ultrasound unit.  Impression: Technically successful ultrasound guided injection.  Impression and Recommendations:    Plantar fasciitis, left Injection as above. Referral out for Rehab exercises given. Return in 1 month. ___________________________________________ Gwen Her. Dianah Field, M.D., ABFM., CAQSM. Primary Care and Sports Medicine [facility]  Adjunct Professor of [employer] [facility] [employer]

## 2019-01-30 ENCOUNTER — Other Ambulatory Visit: Payer: Self-pay | Admitting: *Deleted

## 2019-01-30 ENCOUNTER — Other Ambulatory Visit: Payer: Self-pay

## 2019-01-30 DIAGNOSIS — E038 Other specified hypothyroidism: Secondary | ICD-10-CM

## 2019-01-30 DIAGNOSIS — M1A09X0 Idiopathic chronic gout, multiple sites, without tophus (tophi): Secondary | ICD-10-CM

## 2019-01-30 MED ORDER — COLCHICINE 0.6 MG PO TABS: 0.6000 mg | ORAL_TABLET | ORAL | 2 refills | Status: DC | PRN

## 2019-01-30 MED ORDER — ALLOPURINOL 100 MG PO TABS: ORAL_TABLET | ORAL | 1 refills | Status: DC

## 2019-01-30 MED ORDER — LEVOTHYROXINE SODIUM 50 MCG PO TABS: 50.0000 ug | ORAL_TABLET | Freq: Every day | ORAL | 1 refills | Status: DC

## 2019-02-13 ENCOUNTER — Encounter: Payer: 59 | Admitting: Family Medicine

## 2019-03-14 ENCOUNTER — Other Ambulatory Visit: Payer: Self-pay

## 2019-03-14 DIAGNOSIS — E782 Mixed hyperlipidemia: Secondary | ICD-10-CM

## 2019-03-14 DIAGNOSIS — E559 Vitamin D deficiency, unspecified: Secondary | ICD-10-CM

## 2019-03-14 DIAGNOSIS — E039 Hypothyroidism, unspecified: Secondary | ICD-10-CM

## 2019-03-14 DIAGNOSIS — D649 Anemia, unspecified: Secondary | ICD-10-CM

## 2019-03-15 LAB — CBC WITH DIFFERENTIAL/PLATELET
Absolute Monocytes: 760 cells/uL (ref 200–950)
Basophils Absolute: 38 cells/uL (ref 0–200)
Basophils Relative: 0.4 %
Eosinophils Absolute: 219 cells/uL (ref 15–500)
Eosinophils Relative: 2.3 %
HCT: 43.3 % (ref 38.5–50.0)
Hemoglobin: 14.9 g/dL (ref 13.2–17.1)
Lymphs Abs: 2774 cells/uL (ref 850–3900)
MCH: 28.8 pg (ref 27.0–33.0)
MCHC: 34.4 g/dL (ref 32.0–36.0)
MCV: 83.8 fL (ref 80.0–100.0)
MPV: 9.4 fL (ref 7.5–12.5)
Monocytes Relative: 8 %
Neutro Abs: 5710 cells/uL (ref 1500–7800)
Neutrophils Relative %: 60.1 %
Platelets: 281 10*3/uL (ref 140–400)
RBC: 5.17 10*6/uL (ref 4.20–5.80)
RDW: 14.2 % (ref 11.0–15.0)
Total Lymphocyte: 29.2 %
WBC: 9.5 10*3/uL (ref 3.8–10.8)

## 2019-03-15 LAB — COMPLETE METABOLIC PANEL WITH GFR
AG Ratio: 1.9 (calc) (ref 1.0–2.5)
ALT: 18 U/L (ref 9–46)
AST: 20 U/L (ref 10–35)
Albumin: 3.9 g/dL (ref 3.6–5.1)
Alkaline phosphatase (APISO): 43 U/L (ref 35–144)
BUN/Creatinine Ratio: 17 (calc) (ref 6–22)
BUN: 24 mg/dL (ref 7–25)
CO2: 23 mmol/L (ref 20–32)
Calcium: 9.4 mg/dL (ref 8.6–10.3)
Chloride: 104 mmol/L (ref 98–110)
Creat: 1.41 mg/dL — ABNORMAL HIGH (ref 0.70–1.25)
GFR, Est African American: 61 mL/min/{1.73_m2} (ref >=60)
GFR, Est Non African American: 52 mL/min/{1.73_m2} — ABNORMAL LOW (ref >=60)
Globulin: 2.1 g/dL (calc) (ref 1.9–3.7)
Glucose, Bld: 88 mg/dL (ref 65–99)
Potassium: 4.3 mmol/L (ref 3.5–5.3)
Sodium: 138 mmol/L (ref 135–146)
Total Bilirubin: 0.9 mg/dL (ref 0.2–1.2)
Total Protein: 6 g/dL — ABNORMAL LOW (ref 6.1–8.1)

## 2019-03-15 LAB — TSH: TSH: 0.7 mIU/L (ref 0.40–4.50)

## 2019-03-15 LAB — LIPID PANEL W/REFLEX DIRECT LDL
Cholesterol: 234 mg/dL — ABNORMAL HIGH (ref <200)
HDL: 45 mg/dL (ref >=40)
LDL Cholesterol (Calc): 157 mg/dL (calc) — ABNORMAL HIGH
Non-HDL Cholesterol (Calc): 189 mg/dL (calc) — ABNORMAL HIGH (ref <130)
Total CHOL/HDL Ratio: 5.2 (calc) — ABNORMAL HIGH (ref <5.0)
Triglycerides: 185 mg/dL — ABNORMAL HIGH (ref <150)

## 2019-03-15 LAB — VITAMIN D 25 HYDROXY (VIT D DEFICIENCY, FRACTURES): Vit D, 25-Hydroxy: 86 ng/mL (ref 30–100)

## 2019-03-20 ENCOUNTER — Encounter: Payer: 59 | Admitting: Family Medicine

## 2019-04-22 ENCOUNTER — Other Ambulatory Visit: Payer: Self-pay | Admitting: Family Medicine

## 2019-04-24 ENCOUNTER — Other Ambulatory Visit: Payer: Self-pay | Admitting: Family Medicine

## 2019-05-01 DIAGNOSIS — R7301 Impaired fasting glucose: Secondary | ICD-10-CM | POA: Insufficient documentation

## 2019-06-12 ENCOUNTER — Encounter: Payer: 59 | Admitting: Family Medicine

## 2019-06-19 ENCOUNTER — Ambulatory Visit: Payer: 59

## 2019-06-21 ENCOUNTER — Ambulatory Visit: Payer: 59

## 2019-06-23 ENCOUNTER — Encounter: Payer: 59 | Admitting: Family Medicine

## 2019-07-10 ENCOUNTER — Ambulatory Visit: Payer: Self-pay | Admitting: Family Medicine

## 2019-07-10 ENCOUNTER — Telehealth: Payer: Self-pay | Admitting: *Deleted

## 2019-07-10 NOTE — Telephone Encounter (Signed)
lvm asking if his appt can be switched to tomorrow @ 3PM and he will need to arrive @ 250PM for check in. Advised to rtn call if this is ok.Marland Kitchen,.Elouise Munroe, [place]

## 2019-07-11 ENCOUNTER — Encounter: Payer: Self-pay | Admitting: Family Medicine

## 2019-07-11 ENCOUNTER — Ambulatory Visit (INDEPENDENT_AMBULATORY_CARE_PROVIDER_SITE_OTHER): Payer: Medicare Other | Admitting: Family Medicine

## 2019-07-11 VITALS — BP 170/93 | HR 61 | Ht 65.0 in | Wt 176.0 lb

## 2019-07-11 DIAGNOSIS — I10 Essential (primary) hypertension: Secondary | ICD-10-CM

## 2019-07-11 DIAGNOSIS — Z0000 Encounter for general adult medical examination without abnormal findings: Secondary | ICD-10-CM | POA: Diagnosis not present

## 2019-07-11 MED ORDER — AMLODIPINE BESYLATE 10 MG PO TABS: 10.0000 mg | ORAL_TABLET | Freq: Every day | ORAL | 1 refills | Status: DC

## 2019-07-11 NOTE — Patient Instructions (Signed)
Increase amlodipine to 10 mg.  New prescription sent.  They can track your blood pressure urology next week and then we can check it again here in about 2 weeks just to make sure that looks better and to see if we might need to make any additional adjustments.  10 you to work on increasing your exercise on a you are going to start playing racquetball again which I think is fantastic.  If you have any problems getting your labs next week with urology please let me know.

## 2019-07-11 NOTE — Progress Notes (Signed)
Subjective:   Christopher Gregory is a 63 y.o. male who presents for a Welcome to Medicare exam.   Review of Systems: Comprehensive ROS is negative.    Cardiac Risk Factors include: advanced age (>32mn, >>41women);male gender;hypertension     Objective:    Today's Vitals   07/11/19 1451  BP: (!) 170/93  Pulse: 61  SpO2: 97%  Weight: 176 lb (79.8 kg)  Height: 5' 5"  (1.651 m)   Body mass index is 29.29 kg/m.  Medications Outpatient Encounter Medications as of 07/11/2019  Medication Sig  . allopurinol (ZYLOPRIM) 100 MG tablet TAKE 1 TABLET (100 MG TOTAL) BY MOUTH DAILY.  .Marland KitchenAMBULATORY NON FORMULARY MEDICATION Take 2 capsules by mouth daily. Medication Name: tuna omega-3 oil  . amLODipine (NORVASC) 10 MG tablet Take 1 tablet (10 mg total) by mouth daily.  .Marland Kitchenatenolol (TENORMIN) 100 MG tablet Take by mouth daily.  . Azelastine HCl 0.15 % SOLN INHALE 1 SPRAY INTO BOTH NOSTRILS TWICE A DAY  . Black Pepper-Turmeric (TURMERIC COMPLEX/BLACK PEPPER PO) Take 800 mg by mouth 2 (two) times daily.  . cetirizine (ZYRTEC) 10 MG tablet Take 10 mg by mouth daily.  . Cholecalciferol (VITAMIN D PO) Take 30,000 Int'l Units by mouth daily.  . Coenzyme Q10-Red Yeast Rice (CO Q-10 PLUS RED YEAST RICE PO) Take 2 capsules by mouth daily.  . colchicine (COLCRYS) 0.6 MG tablet Take 1 tablet (0.6 mg total) by mouth as needed.  . doxylamine, Sleep, (UNISOM) 25 MG tablet Take by mouth.  . furosemide (LASIX) 20 MG tablet Take 1 tablet (20 mg total) by mouth daily.  .Marland Kitchenlatanoprost (XALATAN) 0.005 % ophthalmic solution 1 drop at bedtime.  .Marland Kitchenlevothyroxine (SYNTHROID, LEVOTHROID) 50 MCG tablet Take 1 tablet (50 mcg total) by mouth daily.  . Menaquinone-7 (VITAMIN K2 PO) Take 300 mg by mouth daily.  . timolol (BETIMOL) 0.25 % ophthalmic solution 1-2 drops 2 (two) times daily.  . [DISCONTINUED] amLODipine (NORVASC) 5 MG tablet TAKE 1 TABLET BY MOUTH EVERY DAY  . [DISCONTINUED] albuterol (PROVENTIL HFA;VENTOLIN HFA) 108  (90 Base) MCG/ACT inhaler Inhale 2 puffs into the lungs every 6 (six) hours as needed for wheezing or shortness of breath.  . [DISCONTINUED] sulfamethoxazole-trimethoprim (BACTRIM DS,SEPTRA DS) 800-160 MG tablet Take 1 tablet by mouth every Monday, Wednesday, and Friday.   No facility-administered encounter medications on file as of 07/11/2019.      History: Past Medical History:  Diagnosis Date  . Gout   . Hypertension    Past Surgical History:  Procedure Laterality Date  . KNEE ARTHROSCOPY    . TOTAL HIP ARTHROPLASTY  2000  . TOTAL HIP ARTHROPLASTY  2004    Family History  Problem Relation Age of Onset  . Hypertension Mother   . Hyperlipidemia Father    Social History   Occupational History  . 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 Smoking status: Former Smoker    Types: Cigarettes    Quit date: 11/26/1973    Years since quitting: 45.6  . Smokeless tobacco: Never Used  Substance and Sexual Activity  . Alcohol use: No  . Drug use: No  . Sexual activity: Yes    Partners: Female   Tobacco Counseling Counseling given: Not Answered   Immunizations and Health Maintenance Immunization History  Administered Date(s) Administered  . Tdap 11/09/2008, 11/30/2011  . Zoster 02/08/2016   Health Maintenance Due  Topic  Date Due  . COLON CANCER SCREENING ANNUAL FOBT  02/05/2019  . PNA vac Low Risk Adult (1 of 2 - PCV13) 06/11/2019    Activities of Daily Living In your present state of health, do you have any difficulty performing the following activities: 07/11/2019  Hearing? Y  Vision? N  Difficulty concentrating or making decisions? N  Walking or climbing stairs? N  Dressing or bathing? N  Doing errands, shopping? N  Preparing Food and eating ? N  Using the Toilet? N  In the past six months, have you accidently leaked urine? N  Do you have problems with loss of bowel control? N  Managing your Medications? N   Managing your Finances? N  Housekeeping or managing your Housekeeping? N  Some recent data might be hidden    Physical Exam  (optional), or other factors deemed appropriate based on the beneficiary's medical and social history and current clinical standards. Physical Exam Constitutional:      Appearance: He is well-developed.  HENT:     Head: Normocephalic and atraumatic.     Right Ear: External ear normal.     Left Ear: External ear normal.     Nose: Nose normal.  Eyes:     Conjunctiva/sclera: Conjunctivae normal.     Pupils: Pupils are equal, round, and reactive to light.  Neck:     Musculoskeletal: Normal range of motion and neck supple.     Thyroid: No thyromegaly.  Cardiovascular:     Rate and Rhythm: Normal rate and regular rhythm.     Heart sounds: Normal heart sounds.  Pulmonary:     Effort: Pulmonary effort is normal.     Breath sounds: Normal breath sounds.  Abdominal:     General: Bowel sounds are normal. There is no distension.     Palpations: Abdomen is soft. There is no mass.     Tenderness: There is no abdominal tenderness. There is no guarding or rebound.  Musculoskeletal: Normal range of motion.  Lymphadenopathy:     Cervical: No cervical adenopathy.  Skin:    General: Skin is warm and dry.  Neurological:     Mental Status: He is alert and oriented to person, place, and time.     Deep Tendon Reflexes: Reflexes are normal and symmetric.  Psychiatric:        Behavior: Behavior normal.        Thought Content: Thought content normal.        Judgment: Judgment normal.     Advanced Directives:      Assessment:    This is a routine wellness  examination for this patient .   Vision/Hearing screen No exam data present  Dietary issues and exercise activities discussed:  Current Exercise Habits: Home exercise routine, Type of exercise: Other - see comments(bike riding, racquet ball), Time (Minutes): 60, Frequency (Times/Week): 6, Weekly Exercise  (Minutes/Week): 360, Intensity: Moderate, Exercise limited by: None identified  Goals   None     Depression Screen PHQ 2/9 Scores 07/11/2019 08/02/2017 11/25/2015  PHQ - 2 Score 0 0 0     Fall Risk Fall Risk  07/11/2019  Falls in the past year? 0  Number falls in past yr: 0  Injury with Fall? 0    Cognitive Function     6CIT Screen 07/11/2019  What Year? 0 points  What month? 0 points  What time? 0 points  Count back from 20 0 points  Months in reverse 0 points  Repeat phrase  0 points  Total Score 0    Patient Care Team: Hali Marry, MD as PCP - General (Family Medicine) Dr. Rolley Sims (Ophthalmology) Adegoroye, Wynona Luna, MD as Referring Physician (Specialist)     Plan:   Medicare Wellness Exam   I have personally reviewed and noted the following in the patient's chart:   . Medical and social history . Use of alcohol, tobacco or illicit drugs  . Current medications and supplements . Functional ability and status . Nutritional status . Physical activity . Advanced directives . List of other physicians . Hospitalizations, surgeries, and ER visits in previous 12 months . Vitals . Screenings to include cognitive, depression, and falls . Referrals and appointments . EKG shows sinus rhythm with a rate of 74 bpm with incomplete right bundle branch block and a PVC.  In addition, I have reviewed and discussed with patient certain preventive protocols, quality metrics, and best practice recommendations. A written personalized care plan for preventive services as well as general preventive health recommendations were provided to patient.    Beatrice Lecher, MD 07/11/2019

## 2019-07-20 ENCOUNTER — Telehealth: Payer: Self-pay

## 2019-07-20 DIAGNOSIS — I10 Essential (primary) hypertension: Secondary | ICD-10-CM

## 2019-07-20 DIAGNOSIS — M1A09X0 Idiopathic chronic gout, multiple sites, without tophus (tophi): Secondary | ICD-10-CM

## 2019-07-20 NOTE — Telephone Encounter (Signed)
Christopher Gregory called wanted Dr Madilyn Fireman to order labs. She states Christopher Gregory will know which labs to order.

## 2019-07-21 NOTE — Telephone Encounter (Signed)
Spoke w/pt's wife and advised her that I will place orders for bmp and uric. He can go anytime.Audelia Hives [place]

## 2019-07-21 NOTE — Telephone Encounter (Signed)
Christopher Gregory called back again today for lab orders. I don't know what labs Dr Madilyn Fireman was wanting. Would you know which labs?

## 2019-07-26 ENCOUNTER — Ambulatory Visit (INDEPENDENT_AMBULATORY_CARE_PROVIDER_SITE_OTHER): Payer: Medicare Other | Admitting: Family Medicine

## 2019-07-26 ENCOUNTER — Other Ambulatory Visit: Payer: Self-pay

## 2019-07-26 VITALS — BP 158/74 | HR 57 | Wt 179.0 lb

## 2019-07-26 DIAGNOSIS — I10 Essential (primary) hypertension: Secondary | ICD-10-CM | POA: Diagnosis not present

## 2019-07-26 LAB — BASIC METABOLIC PANEL WITH GFR
BUN/Creatinine Ratio: 16 (calc) (ref 6–22)
BUN: 23 mg/dL (ref 7–25)
CO2: 28 mmol/L (ref 20–32)
Calcium: 9.4 mg/dL (ref 8.6–10.3)
Chloride: 104 mmol/L (ref 98–110)
Creat: 1.45 mg/dL — ABNORMAL HIGH (ref 0.70–1.25)
GFR, Est African American: 58 mL/min/{1.73_m2} — ABNORMAL LOW (ref >=60)
GFR, Est Non African American: 50 mL/min/{1.73_m2} — ABNORMAL LOW (ref >=60)
Glucose, Bld: 108 mg/dL — ABNORMAL HIGH (ref 65–99)
Potassium: 4.4 mmol/L (ref 3.5–5.3)
Sodium: 140 mmol/L (ref 135–146)

## 2019-07-26 LAB — URIC ACID: Uric Acid, Serum: 9.1 mg/dL — ABNORMAL HIGH (ref 4.0–8.0)

## 2019-07-26 MED ORDER — AMLODIPINE BESYLATE 5 MG PO TABS: 5.0000 mg | ORAL_TABLET | Freq: Every day | ORAL | 3 refills | Status: DC

## 2019-07-26 MED ORDER — SPIRONOLACTONE 25 MG PO TABS: 12.5000 mg | ORAL_TABLET | Freq: Every day | ORAL | 0 refills | Status: DC

## 2019-07-26 NOTE — Progress Notes (Signed)
Hypertension-his systolic pressure is much better than it was previously when it was running in the 170s.  But I will have him drop his amlodipine back down to 5 mg.  He is already on atenolol 100 mg a believe he is only taking it once a day so there is some potential to increase that.  I am going to add 12.5 mg of spironolactone for now and see if over the next 2 to 3 weeks his blood pressure improves and the swelling improves as well.  Continue Lasix 20 mg daily as well.  Avoiding ACE inhibitor's because of intolerance. F/U in 2 weeks to check BP  Beatrice Lecher, MD

## 2019-07-26 NOTE — Progress Notes (Signed)
Patient presents to office for blood pressure check. Last week, patient's Amlodipine was increased to 10 mg. He is also taking Lasix 20 mg QD and also Atenolol 100 mg QD.  Patient complains of leg swelling, he has gained 3 pounds since last visit on 07/11/19. Patient does have notable leg swelling today in office.   Per patient, home BP readings have been 150s over 80s.   Findings discussed with provider. Advised patient to reduce Amlodipine back down to 5 mg. No new RX needed, he will split tablets he currently has. Med list updated. Also advised patient that provider will further review his medications and I will call him with update for any changes.

## 2019-07-27 ENCOUNTER — Other Ambulatory Visit: Payer: Self-pay | Admitting: Family Medicine

## 2019-07-27 ENCOUNTER — Encounter: Payer: Self-pay | Admitting: Neurology

## 2019-07-27 DIAGNOSIS — E038 Other specified hypothyroidism: Secondary | ICD-10-CM

## 2019-07-27 NOTE — Progress Notes (Signed)
Patient's wife advised. 2 week follow up scheduled

## 2019-08-04 ENCOUNTER — Other Ambulatory Visit: Payer: Self-pay

## 2019-08-04 DIAGNOSIS — E038 Other specified hypothyroidism: Secondary | ICD-10-CM

## 2019-08-04 MED ORDER — FUROSEMIDE 20 MG PO TABS: 20.0000 mg | ORAL_TABLET | Freq: Every day | ORAL | 1 refills | Status: DC

## 2019-08-04 MED ORDER — LEVOTHYROXINE SODIUM 50 MCG PO TABS: 50.0000 ug | ORAL_TABLET | Freq: Every day | ORAL | 1 refills | Status: DC

## 2019-08-11 ENCOUNTER — Ambulatory Visit (INDEPENDENT_AMBULATORY_CARE_PROVIDER_SITE_OTHER): Payer: Medicare Other | Admitting: Family Medicine

## 2019-08-11 ENCOUNTER — Other Ambulatory Visit: Payer: Self-pay

## 2019-08-11 VITALS — BP 130/82 | HR 51 | Ht 65.0 in | Wt 173.0 lb

## 2019-08-11 DIAGNOSIS — E039 Hypothyroidism, unspecified: Secondary | ICD-10-CM | POA: Diagnosis not present

## 2019-08-11 NOTE — Progress Notes (Signed)
Patient presents to the clinic for a BP check since his last one was elevated while in the office 2 weeks ago.  Patient denies any side effect after starting the spironolactone and he reports that he " has been feeling off since starting the new levothyroxine that he has been on for a couple of days". PCP was notified. He "thinks it may be acid reflux but he had weird sensation in his sternum a couple of days ago but no sweating, feeling faint or chest pain. I think it just acid reflux but I wanted to let my PCP know."  Patient also states that his levothyroxine was changed at [facility]. I talked with PCP and Tonya to verify that the pharmacy called or sent a fax to verify the change of medication and per them no notification of any kind was sent over. I then call [facility] to verify and per Erasmo Downer her pharmacist said it was ok to change without contest from the PCP since the patient has been on Levothyroxine in the past. I stated that per PCP we have to have notification so that we can make sure we have the paitetn on the proper dose of medications and she agrees but no documentation on their side other than it was ok per the pharmacist. PCP aware.  Patient wants to know when he would need to come back for a follow up. Please advise. I will place lab orders and let patient know when to come back for a follow up visit per PCP.

## 2019-08-30 ENCOUNTER — Other Ambulatory Visit: Payer: Self-pay

## 2019-08-30 MED ORDER — ATENOLOL 100 MG PO TABS: 100.0000 mg | ORAL_TABLET | Freq: Every day | ORAL | 0 refills | Status: DC

## 2019-08-30 NOTE — Telephone Encounter (Signed)
Requesting medication RF on Atenolol 100mg  1 QD  Med list shows this was previously written by historical provider  FWD to PCP to send

## 2019-09-05 ENCOUNTER — Encounter: Payer: Self-pay | Admitting: Family Medicine

## 2019-09-07 ENCOUNTER — Telehealth: Payer: Self-pay | Admitting: Family Medicine

## 2019-09-07 DIAGNOSIS — E039 Hypothyroidism, unspecified: Secondary | ICD-10-CM

## 2019-09-07 DIAGNOSIS — M317 Microscopic polyangiitis: Secondary | ICD-10-CM

## 2019-09-07 NOTE — Telephone Encounter (Signed)
Please call pt and remind him due to recheck his thyroid. We need to recheck his urine and CBC as well.  Is he planning on seeing Nephrology this year for a followup? By the way looks like he I schedule with Maudie Mercury for [facility] wellness but we did one in September. Ok to schedule him for next year o her scheduled

## 2019-09-08 NOTE — Telephone Encounter (Signed)
He did see the Nephrologist in September. Advised to go to the lab for lab work.

## 2019-09-08 NOTE — Telephone Encounter (Signed)
Patient states he had lab work in September. He wanted to know if you still needed him to have the labs.

## 2019-09-11 NOTE — Telephone Encounter (Signed)
Yes, still needs to go for blood work for the stuff that I need.  I will leave the specialized labs up to the kidney doctor.

## 2019-09-11 NOTE — Telephone Encounter (Signed)
Spoke w/pt's wife and she stated that the Nephrologist said that Dr. Madilyn Fireman can keep an eye on his kidney function.   Advised her that he should come in to have these labs done. She stated that she will let him know.Elouise Munroe, [place]

## 2019-09-13 LAB — CBC
HCT: 43.8 % (ref 38.5–50.0)
Hemoglobin: 14.9 g/dL (ref 13.2–17.1)
MCH: 29.2 pg (ref 27.0–33.0)
MCHC: 34 g/dL (ref 32.0–36.0)
MCV: 85.7 fL (ref 80.0–100.0)
MPV: 9.4 fL (ref 7.5–12.5)
Platelets: 314 10*3/uL (ref 140–400)
RBC: 5.11 10*6/uL (ref 4.20–5.80)
RDW: 12.8 % (ref 11.0–15.0)
WBC: 12.8 10*3/uL — ABNORMAL HIGH (ref 3.8–10.8)

## 2019-09-13 LAB — TSH: TSH: 0.95 mIU/L (ref 0.40–4.50)

## 2019-09-15 ENCOUNTER — Ambulatory Visit (INDEPENDENT_AMBULATORY_CARE_PROVIDER_SITE_OTHER): Payer: Medicare Other

## 2019-09-15 ENCOUNTER — Other Ambulatory Visit: Payer: Self-pay

## 2019-09-15 ENCOUNTER — Ambulatory Visit (INDEPENDENT_AMBULATORY_CARE_PROVIDER_SITE_OTHER): Payer: Medicare Other | Admitting: Sports Medicine

## 2019-09-15 ENCOUNTER — Encounter: Payer: Self-pay | Admitting: Sports Medicine

## 2019-09-15 DIAGNOSIS — M11272 Other chondrocalcinosis, left ankle and foot: Secondary | ICD-10-CM | POA: Insufficient documentation

## 2019-09-15 DIAGNOSIS — M25472 Effusion, left ankle: Secondary | ICD-10-CM

## 2019-09-15 DIAGNOSIS — M1120 Other chondrocalcinosis, unspecified site: Secondary | ICD-10-CM | POA: Diagnosis not present

## 2019-09-15 DIAGNOSIS — M1A09X0 Idiopathic chronic gout, multiple sites, without tophus (tophi): Secondary | ICD-10-CM | POA: Diagnosis not present

## 2019-09-15 DIAGNOSIS — H9192 Unspecified hearing loss, left ear: Secondary | ICD-10-CM

## 2019-09-15 NOTE — Assessment & Plan Note (Addendum)
X-rays, aspiration, injection. Fluid was cloudy, adding a crystal analysis. Return in 1 month.

## 2019-09-15 NOTE — Progress Notes (Addendum)
Subjective:    CC: Left ankle swelling  HPI: This is a pleasant 63 year old male with a history of gout, he was recently taken off of his colchicine and his allopurinol by nephrology, uric acid levels have been very elevated.  Unfortunately he has noted increasing pain and swelling in his left ankle, moderate, persistent, localized without radiation.  He also endorses some left-sided hearing loss.  He declines any medications for this, he can follow-up with his PCP for it.  I reviewed the past medical history, family history, social history, surgical history, and allergies today and no changes were needed.  Please see the problem list section below in epic for further details.  Past Medical History: Past Medical History:  Diagnosis Date  . Gout   . Hypertension    Past Surgical History: Past Surgical History:  Procedure Laterality Date  . KNEE ARTHROSCOPY    . TOTAL HIP ARTHROPLASTY  2000  . TOTAL HIP ARTHROPLASTY  2004   Social History: Social History   Socioeconomic History  . Marital status: Married    Spouse name: Katharine Look  . Number of children: Not on file  . Years of education: Not on file  . Highest education level: Not on file  Occupational History  . 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 Financial resource strain: Not on file  . Food insecurity    Worry: Not on file    Inability: Not on file  . Transportation needs    Medical: Not on file    Non-medical: Not on file  Tobacco Use  . Smoking status: Former Smoker    Types: Cigarettes    Quit date: 11/26/1973    Years since quitting: 45.8  . Smokeless tobacco: Never Used  Substance and Sexual Activity  . Alcohol use: No  . Drug use: No  . Sexual activity: Yes    Partners: Female  Lifestyle  . Physical activity    Days per week: Not on file    Minutes per session: Not on file  . Stress: Not on file  Relationships  . Social Herbalist on  phone: Not on file    Gets together: Not on file    Attends religious service: Not on file    Active member of club or organization: Not on file    Attends meetings of clubs or organizations: Not on file    Relationship status: Not on file  Other Topics Concern  . Not on file  Social History Narrative   Swimming for exercise.    Family History: Family History  Problem Relation Age of Onset  . Hypertension Mother   . Hyperlipidemia Father    Allergies: Allergies  Allergen Reactions  . Neomycin     Hives  . Ace Inhibitors Other (See Comments)    Allergic reaction  . Amlodipine     Swelling on 50m dose  . Erythromycin Rash   Medications: See med rec.  Review of Systems: No fevers, chills, night sweats, weight loss, chest pain, or shortness of breath.   Objective:    General: Well Developed, well nourished, and in no acute distress.  Neuro: Alert and oriented x3, extra-ocular muscles intact, sensation grossly intact.  HEENT: Normocephalic, atraumatic, pupils equal round reactive to light, neck supple, no masses, no lymphadenopathy, thyroid nonpalpable.  Skin: Warm and dry, no rashes. Cardiac: Regular rate and rhythm, no murmurs rubs or gallops,  no lower extremity edema.  Respiratory: Clear to auscultation bilaterally. Not using accessory muscles, speaking in full sentences. Left ankle: Swollen, erythematous, tender to palpation over the tibiotalar joint Range of motion is full in all directions. Strength is 5/5 in all directions. Stable lateral and medial ligaments; squeeze test and kleiger test unremarkable; No pain at base of 5th MT; No tenderness over cuboid; No tenderness over N spot or navicular prominence No tenderness on posterior aspects of lateral and medial malleolus No sign of peroneal tendon subluxations; Negative tarsal tunnel tinel's Able to walk 4 steps.  Procedure: Real-time Ultrasound Guided aspiration/injection of the left tibiotalar joint Device:  GE Logiq E  Verbal informed consent obtained.  Time-out conducted.  Noted no overlying erythema, induration, or other signs of local infection.  Skin prepped in a sterile fashion.  Local anesthesia: Topical Ethyl chloride.  With sterile technique and under real time ultrasound guidance:  22-gauge needle advanced into the tibiotalar joint, I aspirated approximately 1-1/2 mL of cloudy, straw-colored fluid, syringe switched and 1 cc Kenalog 40, 1 cc lidocaine, 1 cc bupivacaine injected easily Completed without difficulty  Pain immediately resolved suggesting accurate placement of the medication.  Advised to call if fevers/chills, erythema, induration, drainage, or persistent bleeding.  Images permanently stored and available for review in the ultrasound unit.  Impression: Technically successful ultrasound guided injection.  Impression and Recommendations:    Hearing loss in left ear Unclear etiology, he thinks he may have aspirated some water and this may have led to some pain in loss of hearing. He has a benign exam. 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 Declines prednisone, so we will just watch this, if no better at the 1 month follow-up I would like him to touch base with ENT/audiology.  Left ankle swelling X-rays, aspiration, injection. Fluid was cloudy, adding a crystal analysis. Return in 1 month.  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 Uric acid levels have been elevated, he is having a gout flare, I will likely restart allopurinol and simply dose it renally.  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  No allopurinol needed.  Can do intraarticular treatments for flares, avoiding allopurinol and colchicine due to renal insufficiency.   ___________________________________________ Gwen Her. Dianah Field, M.D., ABFM., CAQSM. Primary Care and Sports Medicine  [facility]  Adjunct Professor of [employer]

## 2019-09-15 NOTE — Assessment & Plan Note (Addendum)
Was taken off of colchicine and allopurinol by nephrology. Uric acid levels have been elevated, he is having a gout flare, I will likely restart allopurinol and simply dose it renally.  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  No allopurinol needed.  Can do intraarticular treatments for flares, avoiding allopurinol and colchicine due to renal insufficiency.

## 2019-09-15 NOTE — Assessment & Plan Note (Signed)
Unclear etiology, he thinks he may have aspirated some water and this may have led to some pain in loss of hearing. He has a benign exam. 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 Declines prednisone, so we will just watch this, if no better at the 1 month follow-up I would like him to touch base with ENT/audiology.

## 2019-09-16 LAB — SYNOVIAL FLUID, CRYSTAL

## 2019-09-21 ENCOUNTER — Ambulatory Visit (INDEPENDENT_AMBULATORY_CARE_PROVIDER_SITE_OTHER): Payer: Medicare Other | Admitting: Family Medicine

## 2019-09-21 ENCOUNTER — Other Ambulatory Visit: Payer: Self-pay

## 2019-09-21 VITALS — BP 150/86 | HR 54 | Ht 65.0 in | Wt 165.0 lb

## 2019-09-21 DIAGNOSIS — H9192 Unspecified hearing loss, left ear: Secondary | ICD-10-CM

## 2019-09-21 DIAGNOSIS — N1831 Chronic kidney disease, stage 3a: Secondary | ICD-10-CM | POA: Diagnosis not present

## 2019-09-21 DIAGNOSIS — N1832 Chronic kidney disease, stage 3b: Secondary | ICD-10-CM | POA: Insufficient documentation

## 2019-09-21 DIAGNOSIS — M1120 Other chondrocalcinosis, unspecified site: Secondary | ICD-10-CM | POA: Diagnosis not present

## 2019-09-21 DIAGNOSIS — M317 Microscopic polyangiitis: Secondary | ICD-10-CM | POA: Diagnosis not present

## 2019-09-21 NOTE — Progress Notes (Signed)
Acute Office Visit  Subjective:    Patient ID: Christopher Gregory, male    DOB: [birth year], 63 y.o.   MRN: [id number]  Chief Complaint  Patient presents with  . Hearing Problem    HPI Patient is in today for left ear hearing loss.  He says about 2 weeks ago he was actually having some pain in the left ear.  He actually came in to see Dr. Darene Lamer particularly for his ankle but also had him look in his ear.  By then he says that it pain was not quite as bad it was actually starting to feel little bit better but he felt like he was not hearing as well.  Initially he had his wife put some drops in which she thought might be helping the pain for more recently she switched to using a mixture of water and peroxide thinking that there might be a blockage or wax.  He was concerned that he may have actually gotten water behind the eardrum while at the gym.  He said that when Dr. Darene Lamer saw his ear he noted that there was a little bit of redness.  He had recommended a prednisone for the ear and for the gout but unfortunately Damel has had significant reactions.  Typically when he takes prednisone his systolic blood pressure shoots greater than 200 and he gets severe headaches and does not feel well and also raise his glucose.  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    Interestingly when he did blood work recently his white blood cell count was just mildly elevated.  He has not had any rash, fevers or chills or sweats.  No sinus symptoms.  In regards to his gout-he did want to let me know that he did restart his allopurinol.  Dr. Dianah Field felt like he likely had more pseudogout instead of traditional gout in spite of elevated uric acid levels and did not recommend allopurinol.  The patient says that he restarted the 100 mg dose on his own.    Past Medical History:  Diagnosis Date  . Gout   . Hypertension     Past Surgical History:  Procedure Laterality Date  . KNEE  ARTHROSCOPY    . TOTAL HIP ARTHROPLASTY  2000  . TOTAL HIP ARTHROPLASTY  2004    Family History  Problem Relation Age of Onset  . Hypertension Mother   . Hyperlipidemia Father     Social History   Socioeconomic History  . Marital status: Married    Spouse name: Katharine Look  . Number of children: Not on file  . Years of education: Not on file  . Highest education level: Not on file  Occupational History  . 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 Financial resource strain: Not on file  . Food insecurity    Worry: Not on file    Inability: Not on file  . Transportation needs    Medical: Not on file    Non-medical: Not on file  Tobacco Use  . Smoking status: Former Smoker    Types: Cigarettes    Quit date: 11/26/1973    Years since quitting: 45.8  . Smokeless tobacco: Never Used  Substance and Sexual Activity  . Alcohol use: No  . Drug use: No  . Sexual activity: Yes    Partners: Female  Lifestyle  . Physical  activity    Days per week: Not on file    Minutes per session: Not on file  . Stress: Not on file  Relationships  . Social Herbalist on phone: Not on file    Gets together: Not on file    Attends religious service: Not on file    Active member of club or organization: Not on file    Attends meetings of clubs or organizations: Not on file    Relationship status: Not on file  . Intimate partner violence    Fear of current or ex partner: Not on file    Emotionally abused: Not on file    Physically abused: Not on file    Forced sexual activity: Not on file  Other Topics Concern  . Not on file  Social History Narrative   Swimming for exercise.     Outpatient Medications Prior to Visit  Medication Sig Dispense Refill  . AMBULATORY NON FORMULARY MEDICATION Take 2 capsules by mouth daily. Medication Name: tuna omega-3 oil    . amLODipine (NORVASC) 5 MG tablet Take 1 tablet (5 mg total) by mouth  daily. 90 tablet 3  . atenolol (TENORMIN) 100 MG tablet Take 1 tablet (100 mg total) by mouth daily. 90 tablet 0  . Azelastine HCl 0.15 % SOLN INHALE 1 SPRAY INTO BOTH NOSTRILS TWICE A DAY 90 mL 1  . Black Pepper-Turmeric (TURMERIC COMPLEX/BLACK PEPPER PO) Take 800 mg by mouth 2 (two) times daily.    . cetirizine (ZYRTEC) 10 MG tablet Take 10 mg by mouth daily.    . Cholecalciferol (VITAMIN D PO) Take 30,000 Int'l Units by mouth daily.    . Coenzyme Q10-Red Yeast Rice (CO Q-10 PLUS RED YEAST RICE PO) Take 2 capsules by mouth daily.    Marland Kitchen doxylamine, Sleep, (UNISOM) 25 MG tablet Take by mouth.    . furosemide (LASIX) 20 MG tablet Take 1 tablet (20 mg total) by mouth daily. 90 tablet 1  . latanoprost (XALATAN) 0.005 % ophthalmic solution 1 drop at bedtime.    Marland Kitchen levothyroxine (SYNTHROID) 50 MCG tablet Take 1 tablet (50 mcg total) by mouth daily. 90 tablet 1  . Menaquinone-7 (VITAMIN K2 PO) Take 300 mg by mouth daily.    Marland Kitchen spironolactone (ALDACTONE) 25 MG tablet Take 0.5 tablets (12.5 mg total) by mouth daily. 45 tablet 0  . timolol (BETIMOL) 0.25 % ophthalmic solution 1-2 drops 2 (two) times daily.     No facility-administered medications prior to visit.     Allergies  Allergen Reactions  . Neomycin     Hives  . Ace Inhibitors Other (See Comments)    Allergic reaction  . Amlodipine     Swelling on 53m dose  . Prednisone Other (See Comments)    Urgent BP and inc blood glucose  . Erythromycin Rash    ROS     Objective:    Physical Exam  BP (!) 150/86   Pulse (!) 54   Ht _0  (1.651 m)   Wt 165 lb (74.8 kg)   SpO2 98%   BMI 27.46 kg/m  Wt Readings from Last 3 Encounters:  09/21/19 165 lb (74.8 kg)  09/15/19 171 lb (77.6 kg)  08/11/19 173 lb (78.5 kg)    Health Maintenance Due  Topic Date Due  . COLON CANCER SCREENING ANNUAL FOBT  02/05/2019  . PNA vac Low Risk Adult (1 of 2 - PCV13) 06/11/2019    There are no preventive  care reminders to display for this  patient.   Lab Results  Component Value Date   TSH 0.95 09/12/2019   Lab Results  Component Value Date   WBC 12.8 (H) 09/12/2019   HGB 14.9 09/12/2019   HCT 43.8 09/12/2019   MCV 85.7 09/12/2019   PLT 314 09/12/2019   Lab Results  Component Value Date   NA 140 07/25/2019   K 4.4 07/25/2019   CO2 28 07/25/2019   GLUCOSE 108 (H) 07/25/2019   BUN 23 07/25/2019   CREATININE 1.45 (H) 07/25/2019   BILITOT 0.9 03/14/2019   ALKPHOS 33 (L) 12/28/2016   AST 20 03/14/2019   ALT 18 03/14/2019   PROT 6.0 (L) 03/14/2019   ALBUMIN 4.1 12/28/2016   CALCIUM 9.4 07/25/2019   Lab Results  Component Value Date   CHOL 234 (H) 03/14/2019   Lab Results  Component Value Date   HDL 45 03/14/2019   Lab Results  Component Value Date   LDLCALC 157 (H) 03/14/2019   Lab Results  Component Value Date   TRIG 185 (H) 03/14/2019   Lab Results  Component Value Date   CHOLHDL 5.2 (H) 03/14/2019   Lab Results  Component Value Date   HGBA1C 5.5 01/18/2018       Assessment & Plan:   Problem List Items Addressed This Visit      Cardiovascular and Mediastinum   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  Again we will refer to ear nose and throat for further evaluation.  I would like to recheck his renal function since he did recently restart his allopurinol.      Relevant Orders   BMP w/ GFR     Nervous and Auditory   Hearing loss in left ear - Primary   Relevant Orders   Ambulatory referral to ENT     Musculoskeletal and Integument   Pseudogout    He restarted the 100 mg allopurinol on his own.        Genitourinary   Stage 3a chronic kidney disease      Left ear hearing loss-unclear etiology it started with pain and hearing loss which was pretty sudden and abrupt about 2 weeks ago.  He declined prednisone at that time because of significant side effects in the past.  Today on exam there is some slight  erythema over the left TM but no sign of fluid or infection.  Edematous makes me wonder if he may have had some type of viral illness I do not see any papules or blisters to suggest herpes infection.  Recommend formal ENT referral and consultation for further work-up.  No orders of the defined types were placed in this encounter.    Beatrice Lecher, MD

## 2019-09-21 NOTE — Assessment & Plan Note (Signed)
He restarted the 100 mg allopurinol on his own.

## 2019-09-21 NOTE — Assessment & Plan Note (Signed)
He is worried that his vasculitis could actually be affecting his hearing.  Again we will refer to ear nose and throat for further evaluation.  I would like to recheck his renal function since he did recently restart his allopurinol.

## 2019-09-22 ENCOUNTER — Other Ambulatory Visit: Payer: Self-pay | Admitting: *Deleted

## 2019-09-22 DIAGNOSIS — R7989 Other specified abnormal findings of blood chemistry: Secondary | ICD-10-CM

## 2019-09-22 LAB — BASIC METABOLIC PANEL WITH GFR
BUN/Creatinine Ratio: 20 (calc) (ref 6–22)
BUN: 33 mg/dL — ABNORMAL HIGH (ref 7–25)
CO2: 23 mmol/L (ref 20–32)
Calcium: 9.7 mg/dL (ref 8.6–10.3)
Chloride: 102 mmol/L (ref 98–110)
Creat: 1.69 mg/dL — ABNORMAL HIGH (ref 0.70–1.25)
GFR, Est African American: 48 mL/min/{1.73_m2} — ABNORMAL LOW (ref >=60)
GFR, Est Non African American: 42 mL/min/{1.73_m2} — ABNORMAL LOW (ref >=60)
Glucose, Bld: 100 mg/dL — ABNORMAL HIGH (ref 65–99)
Potassium: 4.5 mmol/L (ref 3.5–5.3)
Sodium: 136 mmol/L (ref 135–146)

## 2019-10-10 ENCOUNTER — Other Ambulatory Visit: Payer: Self-pay | Admitting: Family Medicine

## 2019-10-10 MED ORDER — SPIRONOLACTONE 25 MG PO TABS: 12.5000 mg | ORAL_TABLET | Freq: Every day | ORAL | 0 refills | Status: DC

## 2019-10-12 LAB — BASIC METABOLIC PANEL WITH GFR
BUN/Creatinine Ratio: 18 (calc) (ref 6–22)
BUN: 29 mg/dL — ABNORMAL HIGH (ref 7–25)
CO2: 25 mmol/L (ref 20–32)
Calcium: 9.5 mg/dL (ref 8.6–10.3)
Chloride: 99 mmol/L (ref 98–110)
Creat: 1.59 mg/dL — ABNORMAL HIGH (ref 0.70–1.25)
GFR, Est African American: 52 mL/min/{1.73_m2} — ABNORMAL LOW (ref >=60)
GFR, Est Non African American: 45 mL/min/{1.73_m2} — ABNORMAL LOW (ref >=60)
Glucose, Bld: 105 mg/dL — ABNORMAL HIGH (ref 65–99)
Potassium: 4.7 mmol/L (ref 3.5–5.3)
Sodium: 132 mmol/L — ABNORMAL LOW (ref 135–146)

## 2019-10-13 ENCOUNTER — Other Ambulatory Visit: Payer: Self-pay

## 2019-10-13 ENCOUNTER — Encounter: Payer: Self-pay | Admitting: Sports Medicine

## 2019-10-13 ENCOUNTER — Ambulatory Visit (INDEPENDENT_AMBULATORY_CARE_PROVIDER_SITE_OTHER): Payer: Medicare Other | Admitting: Sports Medicine

## 2019-10-13 DIAGNOSIS — M11272 Other chondrocalcinosis, left ankle and foot: Secondary | ICD-10-CM | POA: Diagnosis not present

## 2019-10-13 MED ORDER — ALLOPURINOL 100 MG PO TABS: 100.0000 mg | ORAL_TABLET | Freq: Every day | ORAL | 6 refills | Status: DC

## 2019-10-13 NOTE — Progress Notes (Signed)
Subjective:    CC: Follow-up  HPI: Christopher Gregory returns, he has somewhat of a complicated situation, he has gout and pseudogout but he also has renal insufficiency, he has been on and off of his allopurinol and colchicine, he has had a few flares, the most recent of which was in the ankle, I injected it and it was better the next day, continues to do well.  He is wondering if he can go back on his allopurinol.  He was only taking 100 mg daily.  He is concerned about his renal function but he was reassured that we would simply renally dose his allopurinol.  I reviewed the past medical history, family history, social history, surgical history, and allergies today and no changes were needed.  Please see the problem list section below in epic for further details.  Past Medical History: Past Medical History:  Diagnosis Date  . Gout   . Hypertension    Past Surgical History: Past Surgical History:  Procedure Laterality Date  . KNEE ARTHROSCOPY    . TOTAL HIP ARTHROPLASTY  2000  . TOTAL HIP ARTHROPLASTY  2004   Social History: Social History   Socioeconomic History  . Marital status: Married    Spouse name: Sandra  . Number of children: Not on file  . Years of education: Not on file  . Highest education level: Not on file  Occupational History  . 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 Financial resource strain: Not on file  . Food insecurity    Worry: Not on file    Inability: Not on file  . Transportation needs    Medical: Not on file    Non-medical: Not on file  Tobacco Use  . Smoking status: Former Smoker    Types: Cigarettes    Quit date: 11/26/1973    Years since quitting: 45.9  . Smokeless tobacco: Never Used  Substance and Sexual Activity  . Alcohol use: No  . Drug use: No  . Sexual activity: Yes    Partners: Female  Lifestyle  . Physical activity    Days per week: Not on file    Minutes per session: Not on file  .  Stress: Not on file  Relationships  . Social connections    Talks on phone: Not on file    Gets together: Not on file    Attends religious service: Not on file    Active member of club or organization: Not on file    Attends meetings of clubs or organizations: Not on file    Relationship status: Not on file  Other Topics Concern  . Not on file  Social History Narrative   Swimming for exercise.    Family History: Family History  Problem Relation Age of Onset  . Hypertension Mother   . Hyperlipidemia Father    Allergies: Allergies  Allergen Reactions  . Neomycin     Hives  . Ace Inhibitors Other (See Comments)    Allergic reaction  . Amlodipine     Swelling on 10mg dose  . Prednisone Other (See Comments)    Urgent BP and inc blood glucose  . Erythromycin Rash   Medications: See med rec.  Review of Systems: No fevers, chills, night sweats, weight loss, chest pain, or shortness of breath.   Objective:    General: Well Developed, well nourished, and in no acute distress.  Neuro: Alert and oriented x3,   extra-ocular muscles intact, sensation grossly intact.  HEENT: Normocephalic, atraumatic, pupils equal round reactive to light, neck supple, no masses, no lymphadenopathy, thyroid nonpalpable.  Skin: Warm and dry, no rashes. Cardiac: Regular rate and rhythm, no murmurs rubs or gallops, no lower extremity edema.  Respiratory: Clear to auscultation bilaterally. Not using accessory muscles, speaking in full sentences.  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 I really think he needs to restart his allopurinol, 100 mg daily which kept his levels around 5. He can do this in spite of his renal insufficiency, we just need to renally dose the allopurinol. Ultimately keeping his uric acid levels down will be protective to his kidneys as well and prevent the likelihood of uric acid stones. Colchicine is too expensive  and he can just come in for injections if he has a flare.   ___________________________________________ Thomas J. Thekkekandam, M.D., ABFM., CAQSM. Primary Care and Sports Medicine [facility]  Adjunct Professor of Family Medicine  [employer] 

## 2019-10-13 NOTE — Assessment & Plan Note (Addendum)
Calcium pyrophosphate crystals, pain-free now. I really think he needs to restart his allopurinol, 100 mg daily which kept his levels around 5. He can do this in spite of his renal insufficiency, we just need to renally dose the allopurinol. Ultimately keeping his uric acid levels down will be protective to his kidneys as well and prevent the likelihood of uric acid stones. Colchicine is too expensive and he can just come in for injections if he has a flare.

## 2019-11-24 ENCOUNTER — Other Ambulatory Visit: Payer: Self-pay | Admitting: Family Medicine

## 2019-12-08 ENCOUNTER — Other Ambulatory Visit: Payer: Self-pay | Admitting: Family Medicine

## 2019-12-12 ENCOUNTER — Other Ambulatory Visit: Payer: Self-pay

## 2019-12-12 ENCOUNTER — Ambulatory Visit (INDEPENDENT_AMBULATORY_CARE_PROVIDER_SITE_OTHER): Payer: Medicare Other | Admitting: Sports Medicine

## 2019-12-12 ENCOUNTER — Ambulatory Visit (INDEPENDENT_AMBULATORY_CARE_PROVIDER_SITE_OTHER): Payer: Medicare Other

## 2019-12-12 DIAGNOSIS — M722 Plantar fascial fibromatosis: Secondary | ICD-10-CM | POA: Diagnosis not present

## 2019-12-12 NOTE — Assessment & Plan Note (Signed)
Christopher Gregory is having recurrence of pain in his left heel, plantar aspect worse with the first few steps, clinically this looks like plantar fasciitis. His pain is so severe we did an injection today, continue orthotics, I would like some updated x-rays. Return to see me in 1 month.

## 2019-12-12 NOTE — Progress Notes (Signed)
    Procedures performed today:    Procedure: Real-time Ultrasound Guided injection of the left plantar fascial origin Device: Samsung HS60  Verbal informed consent obtained.  Time-out conducted.  Noted no overlying erythema, induration, or other signs of local infection.  Skin prepped in a sterile fashion.  Local anesthesia: Topical Ethyl chloride.  With sterile technique and under real time ultrasound guidance: 1 cc Kenalog 40, 1 cc lidocaine, 1 cc bupivacaine injected easily Completed without difficulty  Pain immediately resolved suggesting accurate placement of the medication.  Advised to call if fevers/chills, erythema, induration, drainage, or persistent bleeding.  Images permanently stored and available for review in the ultrasound unit.  Impression: Technically successful ultrasound guided injection.  Independent interpretation of tests performed by another provider:   None.  Impression and Recommendations:    Plantar fasciitis, left Christopher Gregory is having recurrence of pain in his left heel, plantar aspect worse with the first few steps, clinically this looks like plantar fasciitis. His pain is so severe we did an injection today, continue orthotics, I would like some updated x-rays. Return to see me in 1 month.    ___________________________________________ Ihor Austin. Benjamin Stain, M.D., ABFM., CAQSM. Primary Care and Sports Medicine [facility]  Adjunct Instructor of Family Medicine  [employer]

## 2019-12-25 ENCOUNTER — Other Ambulatory Visit: Payer: Self-pay | Admitting: Sports Medicine

## 2019-12-25 DIAGNOSIS — M11272 Other chondrocalcinosis, left ankle and foot: Secondary | ICD-10-CM

## 2019-12-25 DIAGNOSIS — E790 Hyperuricemia without signs of inflammatory arthritis and tophaceous disease: Secondary | ICD-10-CM | POA: Insufficient documentation

## 2019-12-25 MED ORDER — ALLOPURINOL 100 MG PO TABS: 100.0000 mg | ORAL_TABLET | Freq: Every day | ORAL | 3 refills | Status: DC

## 2019-12-25 NOTE — Assessment & Plan Note (Signed)
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 We can protect his kidneys from uric acid nephrolithiasis with allopurinol, but his joint flares are typically pseudogout.

## 2020-01-09 ENCOUNTER — Ambulatory Visit (INDEPENDENT_AMBULATORY_CARE_PROVIDER_SITE_OTHER): Payer: Medicare Other | Admitting: Sports Medicine

## 2020-01-09 ENCOUNTER — Encounter: Payer: Self-pay | Admitting: Sports Medicine

## 2020-01-09 ENCOUNTER — Other Ambulatory Visit: Payer: Self-pay

## 2020-01-09 DIAGNOSIS — M11272 Other chondrocalcinosis, left ankle and foot: Secondary | ICD-10-CM

## 2020-01-09 DIAGNOSIS — M722 Plantar fascial fibromatosis: Secondary | ICD-10-CM

## 2020-01-09 MED ORDER — ALLOPURINOL 100 MG PO TABS: 100.0000 mg | ORAL_TABLET | Freq: Every day | ORAL | 3 refills | Status: DC

## 2020-01-09 NOTE — Progress Notes (Signed)
    Procedures performed today:    None.  Independent interpretation of notes and tests performed by another provider:   None.  Impression and Recommendations:    Plantar fasciitis, left [name] returns, I did a plantar fascia injection at the last visit, he is doing extremely well today. No change in plan. Continue rehab exercises, return to see me as needed.    ___________________________________________ Christopher Gregory. Benjamin Stain, M.D., ABFM., CAQSM. Primary Care and Sports Medicine [facility]  Adjunct Instructor of Family Medicine  [employer]

## 2020-01-09 NOTE — Assessment & Plan Note (Signed)
Kaevion returns, I did a plantar fascia injection at the last visit, he is doing extremely well today. No change in plan. Continue rehab exercises, return to see me as needed.

## 2020-01-10 ENCOUNTER — Other Ambulatory Visit: Payer: Self-pay | Admitting: Family Medicine

## 2020-01-27 ENCOUNTER — Other Ambulatory Visit: Payer: Self-pay | Admitting: Family Medicine

## 2020-01-27 DIAGNOSIS — E038 Other specified hypothyroidism: Secondary | ICD-10-CM

## 2020-02-04 ENCOUNTER — Other Ambulatory Visit: Payer: Self-pay | Admitting: Family Medicine

## 2020-02-26 IMAGING — DX DG OS CALCIS 2+V*L*
2 series · 2 of 2 positions shown · non-contrast
Comparison: Left ankle radiograph dated 09/15/2019.

CLINICAL DATA: 63-year-old male with chronic left heel pain.

EXAM:
LEFT OS CALCIS - 2+ VIEW

[calcaneus axial]
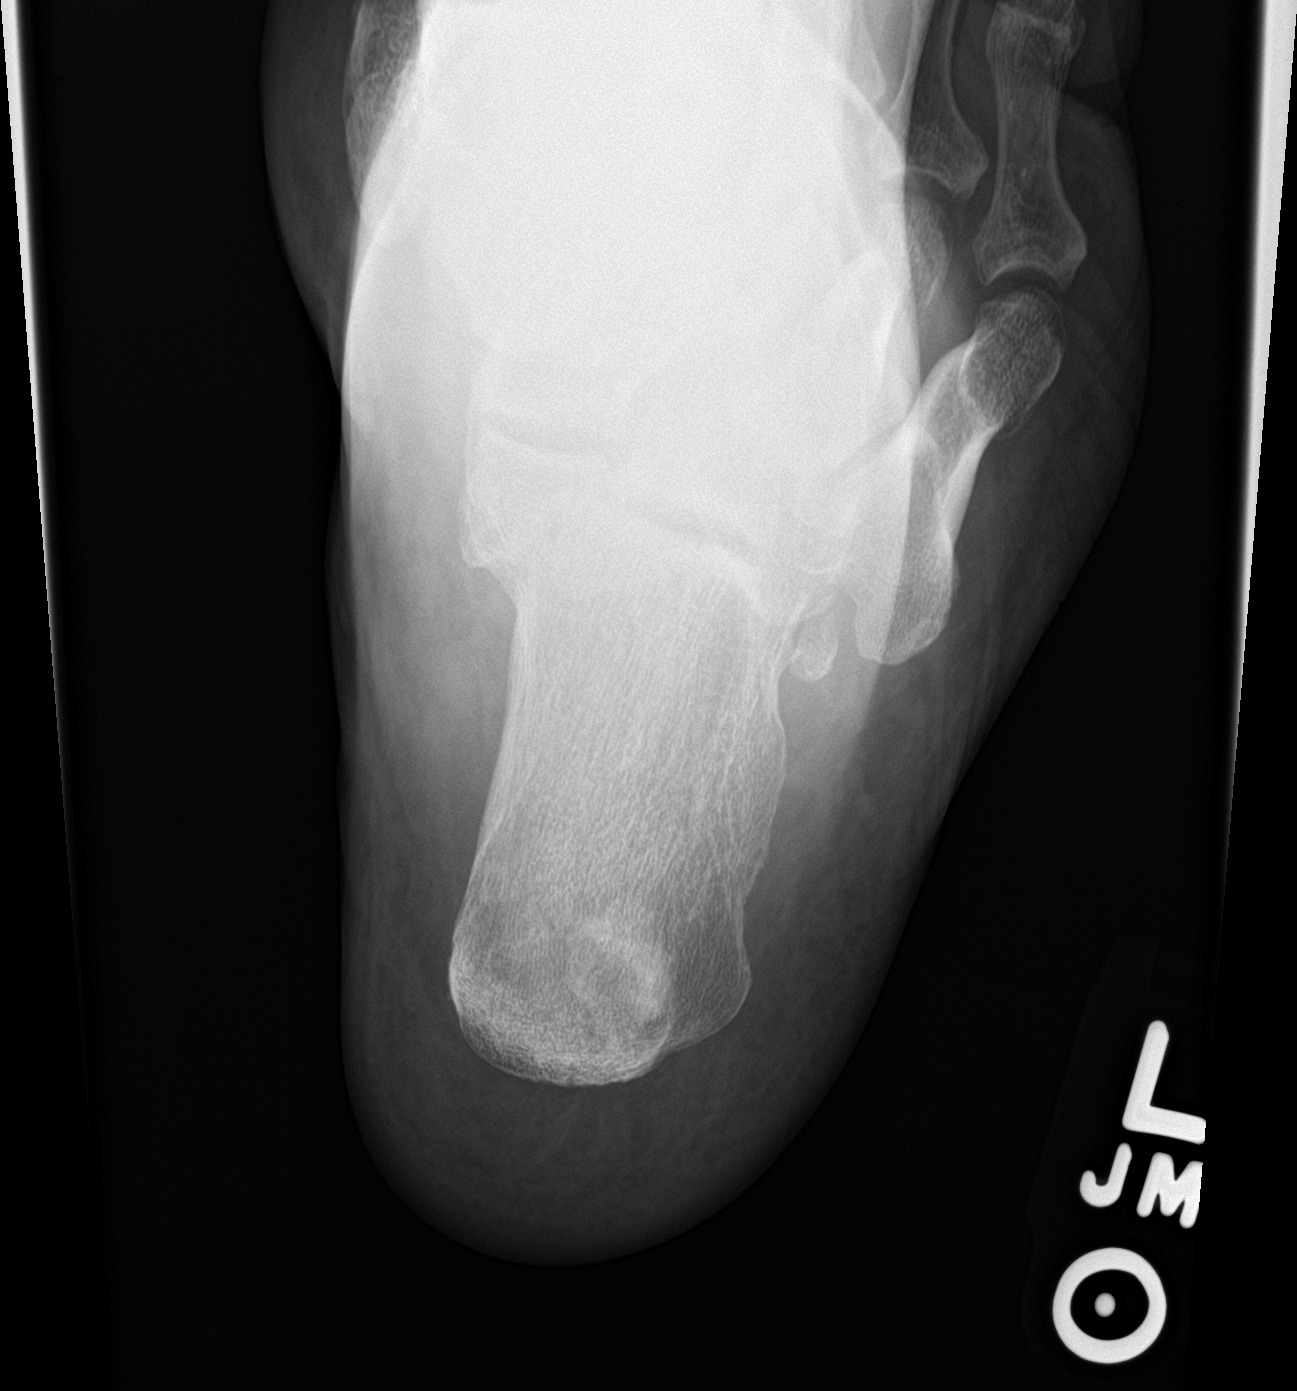

[calcaneus lat]
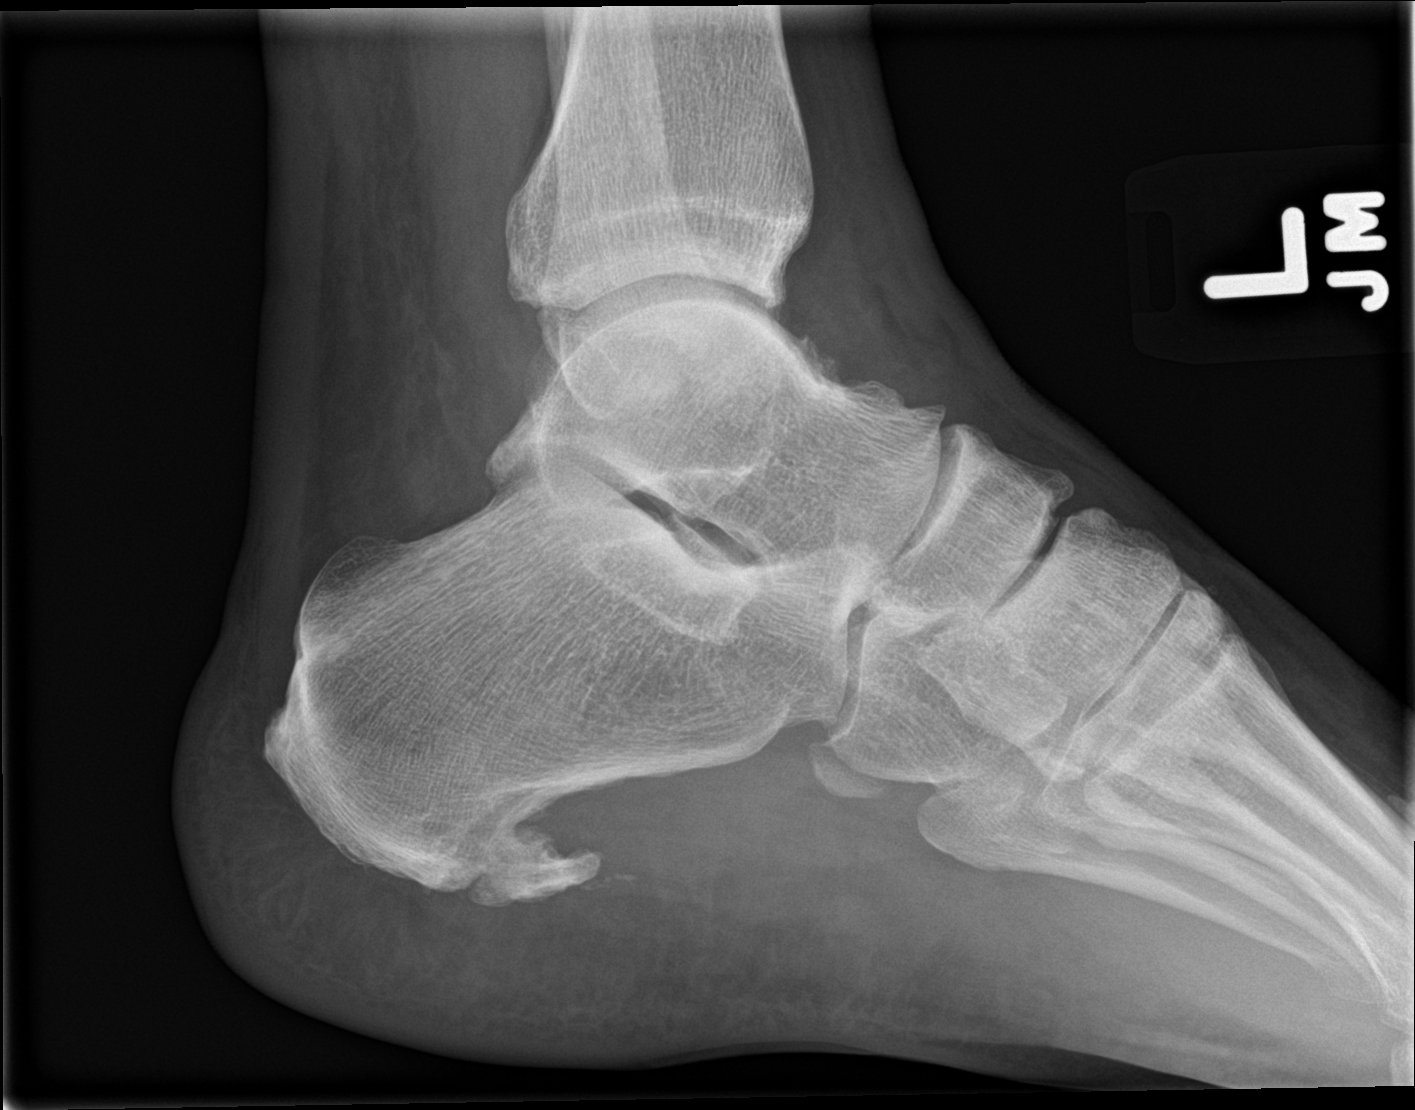

[2 of 2 positions shown; findings below may reference images not displayed]

FINDINGS: There is no acute fracture or dislocation. Calcaneal plantar spur
measures approximately 16 mm. Mild degenerative changes of the
tarsal joints. The soft tissues are unremarkable.
IMPRESSION: 1. No acute fracture or dislocation.
2. A 16 mm plantar calcaneal spur.

## 2020-02-29 ENCOUNTER — Other Ambulatory Visit: Payer: Self-pay

## 2020-02-29 ENCOUNTER — Encounter: Payer: Self-pay | Admitting: Family Medicine

## 2020-02-29 ENCOUNTER — Ambulatory Visit (INDEPENDENT_AMBULATORY_CARE_PROVIDER_SITE_OTHER): Payer: Medicare Other | Admitting: Family Medicine

## 2020-02-29 VITALS — BP 135/88 | HR 56 | Wt 166.0 lb

## 2020-02-29 DIAGNOSIS — M317 Microscopic polyangiitis: Secondary | ICD-10-CM

## 2020-02-29 DIAGNOSIS — M11272 Other chondrocalcinosis, left ankle and foot: Secondary | ICD-10-CM

## 2020-02-29 DIAGNOSIS — N1831 Chronic kidney disease, stage 3a: Secondary | ICD-10-CM

## 2020-02-29 DIAGNOSIS — N057 Unspecified nephritic syndrome with diffuse crescentic glomerulonephritis: Secondary | ICD-10-CM

## 2020-02-29 DIAGNOSIS — E038 Other specified hypothyroidism: Secondary | ICD-10-CM

## 2020-02-29 DIAGNOSIS — E790 Hyperuricemia without signs of inflammatory arthritis and tophaceous disease: Secondary | ICD-10-CM

## 2020-02-29 DIAGNOSIS — I10 Essential (primary) hypertension: Secondary | ICD-10-CM | POA: Diagnosis not present

## 2020-02-29 MED ORDER — COLCHICINE 0.6 MG PO TABS: 0.6000 mg | ORAL_TABLET | Freq: Every day | ORAL | 0 refills | Status: DC | PRN

## 2020-02-29 NOTE — Assessment & Plan Note (Addendum)
Will recheck urine for protein and blood.  Will check CBC and renal function. Consider recheck P-ANCA and antimyeloperoxidae levels.

## 2020-02-29 NOTE — Assessment & Plan Note (Addendum)
ON allopurinol and doing much better. Will try to stick with low dose and recheck renal function to make sure stable.  Uses maybe 10 colchicine tab per year.  Recheck uric acid

## 2020-02-29 NOTE — Assessment & Plan Note (Addendum)
Due to recheck Thyroid function. Asynptomatic. Taking daiy

## 2020-02-29 NOTE — Assessment & Plan Note (Addendum)
Borderline controlled. Start 1/2 tab spironolactone back and monitor BP x 2 wks.  OK to increase to whole if BP not in the 120s. Continue current regimen. Follow up in  6. Due for up dated labs.

## 2020-02-29 NOTE — Progress Notes (Signed)
Established Patient Office Visit  Subjective:  Patient ID: Christopher Gregory, male    DOB: 12-14-1955  Age: 64 y.o. MRN: [id number]  CC: No chief complaint on file.   HPI Christopher Gregory presents for   Hypertension- Pt denies chest pain, SOB, dizziness, or heart palpitations.  Stopped spironolactone x 1 mo bc was going to gym 5 days per week. BP running 140-150s at home off medication.  microscopic polyangitis of the kidneys.  - no change  In urine colore. Says he drinks 2 liters of water a day and eats healthy. Exercising rgularly no longer following with Nephrology.  Reports he is taking Vit D -3 x per week to help with his kidney function.   Gout - had several flares and saw sport med.  Started back low dose allopuinol ( stopped by Nephrology a couple of years ago)  Says flares and pain have improved greatly.  Wants RF colchicine for 15 tab per year.    Past Medical History:  Diagnosis Date  . Gout   . Hypertension     Past Surgical History:  Procedure Laterality Date  . KNEE ARTHROSCOPY    . TOTAL HIP ARTHROPLASTY  2000  . TOTAL HIP ARTHROPLASTY  2004    Family History  Problem Relation Age of Onset  . Hypertension Mother   . Hyperlipidemia Father     Social History   Socioeconomic History  . Marital status: Married    Spouse name: Christopher Gregory  . Number of children: Not on file  . Years of education: Not on file  . Highest education level: Not on file  Occupational History  . 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 Smoking status: Former Smoker    Types: Cigarettes    Quit date: 11/26/1973    Years since quitting: 46.2  . Smokeless tobacco: Never Used  Substance and Sexual Activity  . Alcohol use: No  . Drug use: No  . Sexual activity: Yes    Partners: Female  Other Topics Concern  . Not on file  Social History Narrative   Swimming for exercise.    Social Determinants of Health   Financial Resource Strain:   .  Difficulty of Paying Living Expenses:   Food Insecurity:   . Worried About Charity fundraiser in the Last Year:   . Arboriculturist in the Last Year:   Transportation Needs:   . Film/video editor (Medical):   Marland Kitchen Lack of Transportation (Non-Medical):   Physical Activity:   . Days of Exercise per Week:   . Minutes of Exercise per Session:   Stress:   . Feeling of Stress :   Social Connections:   . Frequency of Communication with Friends and Family:   . Frequency of Social Gatherings with Friends and Family:   . Attends Religious Services:   . Active Member of Clubs or Organizations:   . Attends Archivist Meetings:   Marland Kitchen Marital Status:   Intimate Partner Violence:   . Fear of Current or Ex-Partner:   . Emotionally Abused:   Marland Kitchen Physically Abused:   . Sexually Abused:     Outpatient Medications Prior to Visit  Medication Sig Dispense Refill  . allopurinol (ZYLOPRIM) 100 MG tablet Take 1 tablet (100 mg total) by mouth daily. 90 tablet 3  . AMBULATORY NON FORMULARY MEDICATION Take 2 capsules by mouth daily. Medication Name: tuna omega-3  oil    . amLODipine (NORVASC) 5 MG tablet TAKE ONE TABLET BY MOUTH DAILY 90 tablet 0  . atenolol (TENORMIN) 100 MG tablet TAKE ONE TABLET BY MOUTH DAILY 90 tablet 0  . Azelastine HCl 0.15 % SOLN INHALE 1 SPRAY INTO BOTH NOSTRILS TWICE A DAY 90 mL 1  . Black Pepper-Turmeric (TURMERIC COMPLEX/BLACK PEPPER PO) Take 800 mg by mouth 2 (two) times daily.    . cetirizine (ZYRTEC) 10 MG tablet Take 10 mg by mouth daily.    . Cholecalciferol (VITAMIN D PO) Take 30,000 Int'l Units by mouth daily.    . Coenzyme Q10-Red Yeast Rice (CO Q-10 PLUS RED YEAST RICE PO) Take 2 capsules by mouth daily.    . dorzolamide-timolol (COSOPT) 22.3-6.8 MG/ML ophthalmic solution     . doxylamine, Sleep, (UNISOM) 25 MG tablet Take by mouth.    . furosemide (LASIX) 20 MG tablet TAKE ONE TABLET BY MOUTH DAILY 90 tablet 0  . latanoprost (XALATAN) 0.005 % ophthalmic  solution 1 drop at bedtime.    Marland Kitchen levothyroxine (SYNTHROID) 50 MCG tablet TAKE ONE TABLET BY MOUTH DAILY 90 tablet 0  . Menaquinone-7 (VITAMIN K2 PO) Take 300 mg by mouth daily.    . timolol (BETIMOL) 0.25 % ophthalmic solution 1-2 drops 2 (two) times daily.    . colchicine 0.6 MG tablet Take 0.6 mg by mouth daily.    Marland Kitchen spironolactone (ALDACTONE) 25 MG tablet TAKE ONE-HALF TABLETS BY MOUTH DAILY (Patient not taking: Reported on 02/29/2020) 45 tablet 0   No facility-administered medications prior to visit.    Allergies  Allergen Reactions  . Neomycin     Hives  . Ace Inhibitors Other (See Comments)    Allergic reaction  . Amlodipine     Swelling on 43m dose  . Prednisone Other (See Comments)    Urgent BP and inc blood glucose  . Erythromycin Rash    ROS Review of Systems    Objective:    Physical Exam  Constitutional: He is oriented to person, place, and time. He appears well-developed and well-nourished.  HENT:  Head: Normocephalic and atraumatic.  Cardiovascular: Normal rate, regular rhythm and normal heart sounds.  Pulmonary/Chest: Effort normal and breath sounds normal.  Neurological: He is alert and oriented to person, place, and time.  Skin: Skin is warm and dry.  Psychiatric: He has a normal mood and affect. His behavior is normal.    BP 135/88 (BP Location: Left Arm, Patient Position: Sitting, Cuff Size: Normal)   Pulse (!) 56   Wt 166 lb (75.3 kg)   SpO2 98%   BMI 27.62 kg/m  Wt Readings from Last 3 Encounters:  02/29/20 166 lb (75.3 kg)  01/09/20 166 lb (75.3 kg)  10/13/19 164 lb (74.4 kg)     Health Maintenance Due  Topic Date Due  . COVID-19 Vaccine (1) Never done  . COLON CANCER SCREENING ANNUAL FOBT  02/05/2019  . PNA vac Low Risk Adult (1 of 2 - PCV13) Never done    There are no preventive care reminders to display for this patient.  Lab Results  Component Value Date   TSH 0.95 09/12/2019   Lab Results  Component Value Date   WBC 12.8  (H) 09/12/2019   HGB 14.9 09/12/2019   HCT 43.8 09/12/2019   MCV 85.7 09/12/2019   PLT 314 09/12/2019   Lab Results  Component Value Date   NA 132 (L) 10/11/2019   K 4.7 10/11/2019   CO2 25  10/11/2019   GLUCOSE 105 (H) 10/11/2019   BUN 29 (H) 10/11/2019   CREATININE 1.59 (H) 10/11/2019   BILITOT 0.9 03/14/2019   ALKPHOS 33 (L) 12/28/2016   AST 20 03/14/2019   ALT 18 03/14/2019   PROT 6.0 (L) 03/14/2019   ALBUMIN 4.1 12/28/2016   CALCIUM 9.5 10/11/2019   Lab Results  Component Value Date   CHOL 234 (H) 03/14/2019   Lab Results  Component Value Date   HDL 45 03/14/2019   Lab Results  Component Value Date   LDLCALC 157 (H) 03/14/2019   Lab Results  Component Value Date   TRIG 185 (H) 03/14/2019   Lab Results  Component Value Date   CHOLHDL 5.2 (H) 03/14/2019   Lab Results  Component Value Date   HGBA1C 5.5 01/18/2018      Assessment & Plan:   Problem List Items Addressed This Visit      Cardiovascular and Mediastinum   Microscopic polyangiitis ([place])    Will recheck urine for protein and blood.  Will check CBC and renal function. Consider recheck P-ANCA and antimyeloperoxidae levels.       Relevant Orders   Urine Microalbumin w/creat. ratio   Urinalysis, Routine w reflex microscopic   Essential hypertension, benign - Primary    Borderline controlled. Start 1/2 tab spironolactone back and monitor BP x 2 wks.  OK to increase to whole if BP not in the 120s. Continue current regimen. Follow up in  6. Due for up dated labs.       Relevant Orders   COMPLETE METABOLIC PANEL WITH GFR   Lipid panel   CBC     Endocrine   Hypothyroidism    Due to recheck Thyroid function. Asynptomatic. Taking daiy      Relevant Orders   TSH     Musculoskeletal and Integument   Pseudogout of left ankle    ON allopurinol and doing much better. Will try to stick with low dose and recheck renal function to make sure stable.  Uses maybe 10 colchicine tab per year.  Recheck  uric acid      Relevant Medications   colchicine 0.6 MG tablet     Genitourinary   Stage 3a chronic kidney disease    Due to recheck renal function.       Relevant Orders   COMPLETE METABOLIC PANEL WITH GFR   Lipid panel   CBC   Crescentic glomerulonephritis    Was following w/ Dr. Carrolyn Meiers until 2019. He opted not to undergo traetment.  Will follow CR.  Check UA today.         Other   Hyperuricemia   Relevant Orders   Uric acid      Meds ordered this encounter  Medications  . colchicine 0.6 MG tablet    Sig: Take 1 tablet (0.6 mg total) by mouth daily as needed.    Dispense:  15 tablet    Refill:  0    Pt will call when needs filled.    Follow-up: Return in about 4 weeks (around 03/28/2020) for Hypertension.    Beatrice Lecher, MD

## 2020-02-29 NOTE — Assessment & Plan Note (Addendum)
Was following w/ Dr. Bland Span until 2019. He opted not to undergo traetment.  Will follow CR.  Check UA today.

## 2020-02-29 NOTE — Progress Notes (Signed)
Christopher Gregory has been checking his BP at home. States numbers have been normal most days.   He is exercising 5 days per week at the gym. Therefore, he has tried the experiment of stopping Spironolactone x 1 month.   Initial BP today = 158/86; 2nd 135/88

## 2020-02-29 NOTE — Assessment & Plan Note (Signed)
Due to recheck renal function. 

## 2020-02-29 NOTE — Patient Instructions (Signed)
Please restart your spironolactone.  You can start with a half a tab daily.  For 2 weeks.  Check your blood pressures and if they are still running higher than 130 then increase to a whole tab daily.

## 2020-03-02 ENCOUNTER — Other Ambulatory Visit: Payer: Self-pay | Admitting: Family Medicine

## 2020-03-06 ENCOUNTER — Other Ambulatory Visit: Payer: Self-pay

## 2020-03-06 MED ORDER — FUROSEMIDE 20 MG PO TABS: 20.0000 mg | ORAL_TABLET | Freq: Every day | ORAL | 0 refills | Status: DC

## 2020-03-06 MED ORDER — ATENOLOL 100 MG PO TABS: 100.0000 mg | ORAL_TABLET | Freq: Every day | ORAL | 1 refills | Status: DC

## 2020-03-07 LAB — URINALYSIS, ROUTINE W REFLEX MICROSCOPIC
Bacteria, UA: NONE SEEN /HPF
Bilirubin Urine: NEGATIVE
Glucose, UA: NEGATIVE
Hyaline Cast: NONE SEEN /LPF
Ketones, ur: NEGATIVE
Leukocytes,Ua: NEGATIVE
Nitrite: NEGATIVE
Specific Gravity, Urine: 1.01 (ref 1.001–1.03)
Squamous Epithelial / HPF: NONE SEEN /HPF (ref <=5)
WBC, UA: NONE SEEN /HPF (ref 0–5)
pH: <=5 (ref 5.0–8.0)

## 2020-03-07 LAB — CBC
HCT: 52 % — ABNORMAL HIGH (ref 38.5–50.0)
Hemoglobin: 17.3 g/dL — ABNORMAL HIGH (ref 13.2–17.1)
MCH: 28.9 pg (ref 27.0–33.0)
MCHC: 33.3 g/dL (ref 32.0–36.0)
MCV: 86.8 fL (ref 80.0–100.0)
MPV: 9 fL (ref 7.5–12.5)
Platelets: 295 10*3/uL (ref 140–400)
RBC: 5.99 10*6/uL — ABNORMAL HIGH (ref 4.20–5.80)
RDW: 13.5 % (ref 11.0–15.0)
WBC: 10.1 10*3/uL (ref 3.8–10.8)

## 2020-03-07 LAB — LIPID PANEL
Cholesterol: 213 mg/dL — ABNORMAL HIGH (ref <200)
HDL: 42 mg/dL (ref >=40)
LDL Cholesterol (Calc): 131 mg/dL (calc) — ABNORMAL HIGH
Non-HDL Cholesterol (Calc): 171 mg/dL (calc) — ABNORMAL HIGH (ref <130)
Total CHOL/HDL Ratio: 5.1 (calc) — ABNORMAL HIGH (ref <5.0)
Triglycerides: 259 mg/dL — ABNORMAL HIGH (ref <150)

## 2020-03-07 LAB — PAN-ANCA
ANCA Screen: POSITIVE — AB
Myeloperoxidase Abs: 25.2 AI — ABNORMAL HIGH
P-ANCA Titer: 1:40 {titer} — ABNORMAL HIGH
Serine Protease 3: <1 AI

## 2020-03-07 LAB — MICROALBUMIN / CREATININE URINE RATIO
Creatinine, Urine: 25 mg/dL (ref 20–320)
Microalb Creat Ratio: 1476 mcg/mg creat — ABNORMAL HIGH (ref <30)
Microalb, Ur: 36.9 mg/dL

## 2020-03-07 LAB — COMPLETE METABOLIC PANEL WITH GFR
AG Ratio: 1.6 (calc) (ref 1.0–2.5)
ALT: 18 U/L (ref 9–46)
AST: 21 U/L (ref 10–35)
Albumin: 4.1 g/dL (ref 3.6–5.1)
Alkaline phosphatase (APISO): 50 U/L (ref 35–144)
BUN/Creatinine Ratio: 16 (calc) (ref 6–22)
BUN: 24 mg/dL (ref 7–25)
CO2: 24 mmol/L (ref 20–32)
Calcium: 9.4 mg/dL (ref 8.6–10.3)
Chloride: 102 mmol/L (ref 98–110)
Creat: 1.48 mg/dL — ABNORMAL HIGH (ref 0.70–1.25)
GFR, Est African American: 57 mL/min/{1.73_m2} — ABNORMAL LOW (ref >=60)
GFR, Est Non African American: 49 mL/min/{1.73_m2} — ABNORMAL LOW (ref >=60)
Globulin: 2.5 g/dL (calc) (ref 1.9–3.7)
Glucose, Bld: 93 mg/dL (ref 65–99)
Potassium: 4.2 mmol/L (ref 3.5–5.3)
Sodium: 137 mmol/L (ref 135–146)
Total Bilirubin: 1.3 mg/dL — ABNORMAL HIGH (ref 0.2–1.2)
Total Protein: 6.6 g/dL (ref 6.1–8.1)

## 2020-03-07 LAB — URIC ACID: Uric Acid, Serum: 7.1 mg/dL (ref 4.0–8.0)

## 2020-03-07 LAB — TSH: TSH: 2.01 mIU/L (ref 0.40–4.50)

## 2020-03-15 ENCOUNTER — Other Ambulatory Visit: Payer: Self-pay | Admitting: *Deleted

## 2020-03-15 ENCOUNTER — Telehealth: Payer: Self-pay | Admitting: Family Medicine

## 2020-03-15 DIAGNOSIS — R899 Unspecified abnormal finding in specimens from other organs, systems and tissues: Secondary | ICD-10-CM

## 2020-03-15 NOTE — Telephone Encounter (Signed)
Please call pt and remind him due for colon cancer screening. Would he like to do the stool cards again? If no, then please Christopher Gregory decline.

## 2020-03-15 NOTE — Telephone Encounter (Signed)
Spoke w/pt's wife and she will come by and p/u cards for him and her.

## 2020-03-16 LAB — CBC
HCT: 52.1 % — ABNORMAL HIGH (ref 38.5–50.0)
Hemoglobin: 17 g/dL (ref 13.2–17.1)
MCH: 28.7 pg (ref 27.0–33.0)
MCHC: 32.6 g/dL (ref 32.0–36.0)
MCV: 87.9 fL (ref 80.0–100.0)
MPV: 9.4 fL (ref 7.5–12.5)
Platelets: 278 10*3/uL (ref 140–400)
RBC: 5.93 10*6/uL — ABNORMAL HIGH (ref 4.20–5.80)
RDW: 13.8 % (ref 11.0–15.0)
WBC: 8 10*3/uL (ref 3.8–10.8)

## 2020-03-21 ENCOUNTER — Telehealth: Payer: Self-pay

## 2020-03-21 ENCOUNTER — Other Ambulatory Visit: Payer: Self-pay | Admitting: Family Medicine

## 2020-03-21 NOTE — Telephone Encounter (Signed)
Dois Davenport called and states Serjio woke with a headache today. He has taken Excedrin without relief. His blood pressure is 139/84. She states he has headaches off and on lately. She states normally it is resolved with Excedrin. He has been scheduled for an in office visit tomorrow. I asked if I could speak to [facility]. Denies vision problems, chest pain, shortness of breath, arm pain or numbness in face or arms. He did state it was the worst headache he has ever had. I advised him to go to the ED. He states he would if the headache worsens. He does have nausea and will look to see if he has anti-nausea medication. 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

## 2020-03-22 ENCOUNTER — Ambulatory Visit (INDEPENDENT_AMBULATORY_CARE_PROVIDER_SITE_OTHER): Payer: Medicare Other

## 2020-03-22 ENCOUNTER — Ambulatory Visit (INDEPENDENT_AMBULATORY_CARE_PROVIDER_SITE_OTHER): Payer: Medicare Other | Admitting: Family Medicine

## 2020-03-22 ENCOUNTER — Other Ambulatory Visit: Payer: Self-pay

## 2020-03-22 ENCOUNTER — Encounter: Payer: Self-pay | Admitting: Family Medicine

## 2020-03-22 VITALS — BP 137/77 | HR 57 | Ht 65.0 in | Wt 164.0 lb

## 2020-03-22 DIAGNOSIS — R519 Headache, unspecified: Secondary | ICD-10-CM

## 2020-03-22 NOTE — Progress Notes (Signed)
Established Patient Office Visit  Subjective:  Patient ID: Christopher Gregory, male    DOB: [birth year]  Age: 64 y.o. MRN: [id number]  CC:  Chief Complaint  Patient presents with  . Headache    HPI Christopher Gregory presents for HA.  Phone note from yesterday: "Recardo woke with a headache today. He has taken Excedrin without relief. His blood pressure is 139/84. She states he has headaches off and on lately. She states normally it is resolved with Excedrin. He has been scheduled for an in office visit tomorrow. I asked if I could speak to [facility]. Denies vision problems, chest pain, shortness of breath, arm pain or numbness in face or arms. He did state it was the worst headache he has ever had. I advised him to go to the ED. He states he would if the headache worsens. He does have nausea and will look to see if he has anti-nausea medication. 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  It was not always followed by headache and he was told to use Excedrin which has worked well for him ever since then.  He would sometimes get nausea with the episodes.    Past Medical History:  Diagnosis Date  . Gout   . Hypertension     Past Surgical History:  Procedure Laterality Date  . KNEE ARTHROSCOPY    . TOTAL HIP ARTHROPLASTY  2000  . TOTAL HIP ARTHROPLASTY  2004    Family History  Problem Relation Age of Onset  . Hypertension Mother   . Hyperlipidemia Father     Social History   Socioeconomic History  . Marital status: Married    Spouse name: Katharine Look  . Number of children: Not on file  . Years of education: Not on file  . Highest education level: Not on file  Occupational History  . 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 Smoking status: Former Smoker    Types: Cigarettes    Quit date:  11/26/1973    Years since quitting: 46.3  . Smokeless tobacco: Never Used  Substance and Sexual Activity  . Alcohol use: No  . Drug use: No  . Sexual activity: Yes    Partners: Female  Other Topics Concern  . Not on file  Social History Narrative   Swimming for exercise.    Social Determinants of Health   Financial Resource Strain:   . Difficulty of Paying Living Expenses:   Food Insecurity:   . Worried About Charity fundraiser in the Last Year:   . Arboriculturist in the Last Year:   Transportation Needs:   . Film/video editor (Medical):   Marland Kitchen Lack of Transportation (Non-Medical):   Physical Activity:   . Days of Exercise per Week:   . Minutes of Exercise per Session:   Stress:   . Feeling of Stress :   Social Connections:   . Frequency of Communication with Friends and Family:   . Frequency of Social Gatherings with Friends and Family:   . Attends Religious Services:   . Active Member of Clubs or Organizations:   . Attends Archivist Meetings:   Marland Kitchen Marital Status:   Intimate Partner Violence:   . Fear of Current or Ex-Partner:   .  Emotionally Abused:   Marland Kitchen Physically Abused:   . Sexually Abused:     Outpatient Medications Prior to Visit  Medication Sig Dispense Refill  . spironolactone (ALDACTONE) 25 MG tablet TAKE ONE-HALF TABLETS BY MOUTH DAILY (Patient taking differently: Take 25 mg by mouth daily. ) 45 tablet 0  . allopurinol (ZYLOPRIM) 100 MG tablet Take 1 tablet (100 mg total) by mouth daily. 90 tablet 3  . AMBULATORY NON FORMULARY MEDICATION Take 2 capsules by mouth daily. Medication Name: tuna omega-3 oil    . amLODipine (NORVASC) 5 MG tablet TAKE ONE TABLET BY MOUTH DAILY 90 tablet 0  . atenolol (TENORMIN) 100 MG tablet Take 1 tablet (100 mg total) by mouth daily. 90 tablet 1  . Azelastine HCl 0.15 % SOLN INHALE 1 SPRAY INTO BOTH NOSTRILS TWICE A DAY 90 mL 1  . Black Pepper-Turmeric (TURMERIC COMPLEX/BLACK PEPPER PO) Take 800 mg by mouth 2 (two)  times daily.    . cetirizine (ZYRTEC) 10 MG tablet Take 10 mg by mouth daily.    . Cholecalciferol (VITAMIN D PO) Take 30,000 Int'l Units by mouth daily.    . Coenzyme Q10-Red Yeast Rice (CO Q-10 PLUS RED YEAST RICE PO) Take 2 capsules by mouth daily.    . colchicine 0.6 MG tablet Take 1 tablet (0.6 mg total) by mouth daily as needed. 15 tablet 0  . dorzolamide-timolol (COSOPT) 22.3-6.8 MG/ML ophthalmic solution     . doxylamine, Sleep, (UNISOM) 25 MG tablet Take by mouth.    . furosemide (LASIX) 20 MG tablet Take 1 tablet (20 mg total) by mouth daily. 90 tablet 0  . latanoprost (XALATAN) 0.005 % ophthalmic solution 1 drop at bedtime.    Marland Kitchen levothyroxine (SYNTHROID) 50 MCG tablet TAKE ONE TABLET BY MOUTH DAILY 90 tablet 0  . Menaquinone-7 (VITAMIN K2 PO) Take 300 mg by mouth daily.    . timolol (BETIMOL) 0.25 % ophthalmic solution 1-2 drops 2 (two) times daily.     No facility-administered medications prior to visit.    Allergies  Allergen Reactions  . Neomycin     Hives  . Ace Inhibitors Other (See Comments)    Allergic reaction  . Amlodipine     Swelling on 80m dose  . Prednisone Other (See Comments)    Urgent BP and inc blood glucose  . Erythromycin Rash    ROS Review of Systems    Objective:    Physical Exam  Constitutional: He is oriented to person, place, and time. He appears well-developed and well-nourished.  HENT:  Head: Normocephalic and atraumatic.  Right Ear: External ear normal.  Left Ear: External ear normal.  Nose: Nose normal.  Mouth/Throat: Oropharynx is clear and moist.  TMs and canals are clear.   Eyes: Pupils are equal, round, and reactive to light. Conjunctivae and EOM are normal.  Neck: No thyromegaly present.  Cardiovascular: Normal rate and normal heart sounds.  Pulmonary/Chest: Effort normal and breath sounds normal.  Musculoskeletal:        General: No edema.     Cervical back: Neck supple.  Lymphadenopathy:    He has no cervical  adenopathy.  Neurological: He is alert and oriented to person, place, and time. He has normal reflexes. No cranial nerve deficit. Coordination normal.  Skin: Skin is warm and dry.  Psychiatric: He has a normal mood and affect.    BP 137/77   Pulse (!) 57   Ht 5' 5"  (1.651 m)   Wt 164 lb (74.4 kg)  SpO2 98%   BMI 27.29 kg/m  Wt Readings from Last 3 Encounters:  03/22/20 164 lb (74.4 kg)  02/29/20 166 lb (75.3 kg)  01/09/20 166 lb (75.3 kg)     There are no preventive care reminders to display for this patient.  There are no preventive care reminders to display for this patient.  Lab Results  Component Value Date   TSH 2.01 02/29/2020   Lab Results  Component Value Date   WBC 8.0 03/15/2020   HGB 17.0 03/15/2020   HCT 52.1 (H) 03/15/2020   MCV 87.9 03/15/2020   PLT 278 03/15/2020   Lab Results  Component Value Date   NA 137 02/29/2020   K 4.2 02/29/2020   CO2 24 02/29/2020   GLUCOSE 93 02/29/2020   BUN 24 02/29/2020   CREATININE 1.48 (H) 02/29/2020   BILITOT 1.3 (H) 02/29/2020   ALKPHOS 33 (L) 12/28/2016   AST 21 02/29/2020   ALT 18 02/29/2020   PROT 6.6 02/29/2020   ALBUMIN 4.1 12/28/2016   CALCIUM 9.4 02/29/2020   Lab Results  Component Value Date   CHOL 213 (H) 02/29/2020   Lab Results  Component Value Date   HDL 42 02/29/2020   Lab Results  Component Value Date   LDLCALC 131 (H) 02/29/2020   Lab Results  Component Value Date   TRIG 259 (H) 02/29/2020   Lab Results  Component Value Date   CHOLHDL 5.1 (H) 02/29/2020   Lab Results  Component Value Date   HGBA1C 5.5 01/18/2018      Assessment & Plan:   Problem List Items Addressed This Visit    None    Visit Diagnoses    Nonintractable episodic headache, unspecified headache type    -  Primary   Relevant Orders   CT Head Wo Contrast (Completed)     Headache-I am concerned that this headache was the worst he had ever experienced and it was very different from the ones that he had  had previously.  He says the pain was a 15 out of 10 and very sharp.  The more intense pain lasted about an hour before it finally started to recede.  He denies any other neurologic symptoms at that time.  We will move forward with CT scan of the head to rule out stroke tumor etc. blood pressure actually looks good today so most suspicious of uncontrolled hypertension.  No orders of the defined types were placed in this encounter.   Follow-up: Return if symptoms worsen or fail to improve.    Beatrice Lecher, MD

## 2020-03-25 ENCOUNTER — Telehealth: Payer: Self-pay

## 2020-03-25 ENCOUNTER — Encounter: Payer: Self-pay | Admitting: Family Medicine

## 2020-03-25 MED ORDER — SUMATRIPTAN SUCCINATE 100 MG PO TABS
100.0000 mg | ORAL_TABLET | ORAL | 0 refills | Status: DC | PRN
Start: 2020-03-25 — End: 2020-04-16

## 2020-03-25 NOTE — Telephone Encounter (Signed)
Taj's wife called and states the pharmacy has not received the rescue headache medication. Please advise.

## 2020-03-25 NOTE — Telephone Encounter (Signed)
Prescription sent to pharmacy.  I wanted to hold off until I got the head scan results back but that I did forget to send it afterwards.  So I apologize.  Just sent today so he can pick it up this evening if you would like.

## 2020-03-26 ENCOUNTER — Other Ambulatory Visit: Payer: Self-pay | Admitting: *Deleted

## 2020-03-26 DIAGNOSIS — Z1211 Encounter for screening for malignant neoplasm of colon: Secondary | ICD-10-CM

## 2020-03-26 LAB — POC HEMOCCULT BLD/STL (HOME/3-CARD/SCREEN)
Card #2 Fecal Occult Blod, POC: NEGATIVE
Card #3 Fecal Occult Blood, POC: NEGATIVE
Fecal Occult Blood, POC: NEGATIVE

## 2020-03-26 NOTE — Telephone Encounter (Addendum)
Patient's wife advised

## 2020-04-15 ENCOUNTER — Telehealth: Payer: Self-pay

## 2020-04-15 NOTE — Telephone Encounter (Signed)
Christopher Gregory is still having headaches. He has been scheduled for a follow up visit tomorrow.

## 2020-04-16 ENCOUNTER — Other Ambulatory Visit: Payer: Self-pay

## 2020-04-16 ENCOUNTER — Encounter: Payer: Self-pay | Admitting: Family Medicine

## 2020-04-16 ENCOUNTER — Ambulatory Visit (INDEPENDENT_AMBULATORY_CARE_PROVIDER_SITE_OTHER): Payer: Medicare Other | Admitting: Family Medicine

## 2020-04-16 VITALS — BP 151/71 | HR 52 | Temp 98.5°F | Wt 166.0 lb

## 2020-04-16 DIAGNOSIS — M542 Cervicalgia: Secondary | ICD-10-CM | POA: Diagnosis not present

## 2020-04-16 DIAGNOSIS — R519 Headache, unspecified: Secondary | ICD-10-CM

## 2020-04-16 DIAGNOSIS — I10 Essential (primary) hypertension: Secondary | ICD-10-CM | POA: Diagnosis not present

## 2020-04-16 LAB — SEDIMENTATION RATE: Sed Rate: 2 mm/h (ref 0–20)

## 2020-04-16 MED ORDER — SPIRONOLACTONE 25 MG PO TABS: 25.0000 mg | ORAL_TABLET | Freq: Every day | ORAL | 1 refills | Status: DC
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
## 2020-04-16 NOTE — Assessment & Plan Note (Signed)
BP not at goal today. Was better about 3 weeks ago. Home BP in control. Will monitor. Did take Excedrin this AM.

## 2020-04-20 ENCOUNTER — Other Ambulatory Visit: Payer: Self-pay

## 2020-04-20 ENCOUNTER — Ambulatory Visit (INDEPENDENT_AMBULATORY_CARE_PROVIDER_SITE_OTHER): Payer: Medicare Other

## 2020-04-20 DIAGNOSIS — R519 Headache, unspecified: Secondary | ICD-10-CM

## 2020-04-20 DIAGNOSIS — M542 Cervicalgia: Secondary | ICD-10-CM

## 2020-04-24 ENCOUNTER — Other Ambulatory Visit: Payer: Self-pay | Admitting: Family Medicine

## 2020-04-24 DIAGNOSIS — E038 Other specified hypothyroidism: Secondary | ICD-10-CM

## 2020-05-01 ENCOUNTER — Ambulatory Visit: Payer: Medicare Other | Admitting: Rehabilitative and Restorative Service Providers"

## 2020-05-02 ENCOUNTER — Ambulatory Visit (INDEPENDENT_AMBULATORY_CARE_PROVIDER_SITE_OTHER): Payer: Medicare Other | Admitting: Rehabilitative and Restorative Service Providers"

## 2020-05-02 ENCOUNTER — Other Ambulatory Visit: Payer: Self-pay

## 2020-05-02 DIAGNOSIS — M542 Cervicalgia: Secondary | ICD-10-CM | POA: Diagnosis not present
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
## 2020-05-28 ENCOUNTER — Ambulatory Visit: Payer: Medicare Other | Admitting: Family Medicine

## 2020-06-05 ENCOUNTER — Encounter: Payer: Self-pay | Admitting: Sports Medicine

## 2020-06-05 ENCOUNTER — Telehealth: Payer: Self-pay | Admitting: *Deleted

## 2020-06-05 NOTE — Telephone Encounter (Signed)
I wrote a letter this time (see in letters tab) but there is no such thing as a permanent jury duty excuse, it is a requirement as a citizen of this country to do jury duty when called.

## 2020-06-05 NOTE — Telephone Encounter (Signed)
Pt's wife left vm wanting to know if you would write the pt another letter excusing him for jury duty.  She wanted to know if you could write in the letter for him to be permanently excused.  Please advise.

## 2020-06-06 NOTE — Telephone Encounter (Signed)
LMOM notifying pt. 

## 2020-06-11 ENCOUNTER — Encounter: Payer: Self-pay | Admitting: Family Medicine

## 2020-06-14 ENCOUNTER — Encounter: Payer: Self-pay | Admitting: Family Medicine

## 2020-06-17 ENCOUNTER — Other Ambulatory Visit: Payer: Self-pay | Admitting: Family Medicine

## 2020-06-27 ENCOUNTER — Encounter: Payer: Self-pay | Admitting: *Deleted

## 2020-06-27 DIAGNOSIS — Z79899 Other long term (current) drug therapy: Secondary | ICD-10-CM | POA: Insufficient documentation

## 2020-07-16 ENCOUNTER — Ambulatory Visit: Payer: Medicare Other

## 2020-07-28 ENCOUNTER — Other Ambulatory Visit: Payer: Self-pay | Admitting: Family Medicine

## 2020-07-28 DIAGNOSIS — E038 Other specified hypothyroidism: Secondary | ICD-10-CM

## 2020-08-05 ENCOUNTER — Other Ambulatory Visit: Payer: Self-pay

## 2020-08-05 ENCOUNTER — Encounter: Payer: Self-pay | Admitting: Nurse Practitioner

## 2020-08-05 ENCOUNTER — Ambulatory Visit (INDEPENDENT_AMBULATORY_CARE_PROVIDER_SITE_OTHER): Payer: Medicare Other | Admitting: Nurse Practitioner

## 2020-08-05 VITALS — BP 145/90 | HR 53 | Temp 97.8°F | Ht 65.0 in | Wt 159.5 lb

## 2020-08-05 DIAGNOSIS — Z0000 Encounter for general adult medical examination without abnormal findings: Secondary | ICD-10-CM | POA: Diagnosis not present

## 2020-08-05 MED ORDER — FUROSEMIDE 20 MG PO TABS: 20.0000 mg | ORAL_TABLET | Freq: Every day | ORAL | 0 refills | Status: DC

## 2020-08-05 NOTE — Progress Notes (Signed)
Subjective:   Christopher Gregory is a 64 y.o. male who presents for Medicare Annual/Subsequent preventive examination.        Objective:    Today's Vitals   08/05/20 1533  BP: (!) 145/90  Pulse: (!) 53  Temp: 97.8 F (36.6 C)  TempSrc: Oral  SpO2: 97%  Weight: 159 lb 8 oz (72.3 kg)  Height: _0  (1.651 m)   Body mass index is 26.54 kg/m.  Advanced Directives 08/05/2020 11/26/2014 05/01/2014  Does Patient Have a Medical Advance Directive? No No Patient does not have advance directive;Patient would like information  Would patient like information on creating a medical advance directive? Yes (MAU/Ambulatory/Procedural Areas - Information given) Yes - Educational materials given -    Current Medications (verified) Outpatient Encounter Medications as of 08/05/2020  Medication Sig  . allopurinol (ZYLOPRIM) 100 MG tablet Take 1 tablet (100 mg total) by mouth daily.  . AMBULATORY NON FORMULARY MEDICATION Take 2 capsules by mouth daily. Medication Name: tuna omega-3 oil  . amLODipine (NORVASC) 5 MG tablet TAKE ONE TABLET BY MOUTH DAILY  . atenolol (TENORMIN) 100 MG tablet Take 1 tablet (100 mg total) by mouth daily.  . Azelastine HCl 0.15 % SOLN INHALE 1 SPRAY INTO BOTH NOSTRILS TWICE A DAY  . Black Pepper-Turmeric (TURMERIC COMPLEX/BLACK PEPPER PO) Take 800 mg by mouth 2 (two) times daily.  . cetirizine (ZYRTEC) 10 MG tablet Take 10 mg by mouth daily.  . Cholecalciferol (VITAMIN D PO) Take 30,000 Int'l Units by mouth 3 (three) times a week. Monday, Wednesday, & Fridays  . Coenzyme Q10-Red Yeast Rice (CO Q-10 PLUS RED YEAST RICE PO) Take 2 capsules by mouth daily.  . colchicine 0.6 MG tablet Take 1 tablet (0.6 mg total) by mouth daily as needed.  . dorzolamide-timolol (COSOPT) 22.3-6.8 MG/ML ophthalmic solution   . furosemide (LASIX) 20 MG tablet Take 1 tablet (20 mg total) by mouth daily.  Marland Kitchen latanoprost (XALATAN) 0.005 % ophthalmic solution 1 drop at bedtime.  Laurina Bustle Oil OIL at  bedtime as needed.  Marland Kitchen levothyroxine (SYNTHROID) 50 MCG tablet TAKE ONE TABLET BY MOUTH DAILY  . Menaquinone-7 (VITAMIN K2 PO) Take 300 mg by mouth daily.  Marland Kitchen ROCKLATAN 0.02-0.005 % SOLN Apply 1 drop to eye daily.  Marland Kitchen spironolactone (ALDACTONE) 25 MG tablet Take 1 tablet (25 mg total) by mouth daily.  . timolol (BETIMOL) 0.25 % ophthalmic solution 1-2 drops 2 (two) times daily.  Marland Kitchen doxylamine, Sleep, (UNISOM) 25 MG tablet Take by mouth. (Patient not taking: Reported on 08/05/2020)   No facility-administered encounter medications on file as of 08/05/2020.    Allergies (verified) Neomycin, Ace inhibitors, Amlodipine, Prednisone, and Erythromycin   History: Past Medical History:  Diagnosis Date  . Allergy   . Gout   . Hypertension   . Thyroid disease    Past Surgical History:  Procedure Laterality Date  . KNEE ARTHROSCOPY    . TOTAL HIP ARTHROPLASTY  2000  . TOTAL HIP ARTHROPLASTY  2004   Family History  Problem Relation Age of Onset  . Hypertension Mother   . Hyperlipidemia Father    Social History   Socioeconomic History  . Marital status: Married    Spouse name: Katharine Look  . Number of children: Not on file  . Years of education: Not on file  . Highest education level: Not on file  Occupational History  . 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  Smoking status: Former Smoker    Types: Cigarettes    Quit date: 11/26/1973    Years since quitting: 46.7  . Smokeless tobacco: Never Used  Vaping Use  . Vaping Use: Never used  Substance and Sexual Activity  . Alcohol use: No  . Drug use: No  . Sexual activity: Yes    Partners: Female  Other Topics Concern  . Not on file  Social History Narrative   Swimming for exercise.    Social Determinants of Health   Financial Resource Strain:   . Difficulty of Paying Living Expenses: Not on file  Food Insecurity:   . Worried About Charity fundraiser in the Last Year: Not on file   . Ran Out of Food in the Last Year: Not on file  Transportation Needs:   . Lack of Transportation (Medical): Not on file  . Lack of Transportation (Non-Medical): Not on file  Physical Activity:   . Days of Exercise per Week: Not on file  . Minutes of Exercise per Session: Not on file  Stress:   . Feeling of Stress : Not on file  Social Connections:   . Frequency of Communication with Friends and Family: Not on file  . Frequency of Social Gatherings with Friends and Family: Not on file  . Attends Religious Services: Not on file  . Active Member of Clubs or Organizations: Not on file  . Attends Archivist Meetings: Not on file  . Marital Status: Not on file    Tobacco Counseling Counseling given: Not Answered Patient no longer smoking  Clinical Intake: Pre-visit preparation completed: Yes  Pain : No/denies pain     BMI - recorded: 26.54 Nutritional Status: BMI 25 -29 Overweight Nutritional Risks: None Diabetes: No  How often do you need to have someone help you when you read instructions, pamphlets, or other written materials from your doctor or pharmacy?: 1 - Never What is the last grade level you completed in school?: 7 year apprectice  Diabetic No  Interpreter Needed?: No  Information entered by :: [employer] of Daily Living In your present state of health, do you have any difficulty performing the following activities: 08/05/2020  Hearing? Y  Vision? N  Difficulty concentrating or making decisions? N  Walking or climbing stairs? N  Dressing or bathing? N  Doing errands, shopping? N  Preparing Food and eating ? N  Using the Toilet? N  In the past six months, have you accidently leaked urine? N  Do you have problems with loss of bowel control? N  Managing your Medications? N  Managing your Finances? N  Housekeeping or managing your Housekeeping? N  Some recent data might be hidden    Patient Care Team: Hali Marry, MD as PCP  - General (Family Medicine) Dr. Rolley Sims (Ophthalmology) Adegoroye, Wynona Luna, MD as Referring Physician (Specialist)  Indicate any recent Medical Services you may have received from other than [facility] providers in the past year (date may be approximate).     Assessment:   This is a routine wellness examination for [employer].  Hearing/Vision screen No exam data present  Dietary issues and exercise activities discussed: Current Exercise Habits: Home exercise routine, Type of exercise: Other - see comments (racket ball), Time (Minutes): 60, Frequency (Times/Week): 5, Weekly Exercise (Minutes/Week): 300, Intensity: Intense  Goals   Continue exercise 5 days a week.    Depression Screen PHQ 2/9 Scores 08/05/2020 04/16/2020 07/11/2019 08/02/2017 11/25/2015  PHQ -  2 Score 0 0 0 0 0  PHQ- 9 Score - 0 - - -    Fall Risk Fall Risk  08/05/2020 04/16/2020 07/11/2019 11/25/2015  Falls in the past year? 0 0 0 No  Number falls in past yr: 0 - 0 -  Injury with Fall? 0 - 0 -  Follow up Falls evaluation completed - - -    Any stairs in or around the home? Yes  If so, are there any without handrails? No  Home free of loose throw rugs in walkways, pet beds, electrical cords, etc? Yes  Adequate lighting in your home to reduce risk of falls? Yes   ASSISTIVE DEVICES UTILIZED TO PREVENT FALLS:  Life alert? No  Use of a cane, walker or w/c? No  Grab bars in the bathroom? No  Shower chair or bench in shower? No  Elevated toilet seat or a handicapped toilet? Yes   TIMED UP AND GO:  Was the test performed? Yes .  Length of time to ambulate 10 feet: 3  sec.   Gait steady and fast without use of assistive device  Cognitive Function:     6CIT Screen 08/05/2020 07/11/2019  What Year? 0 points 0 points  What month? 0 points 0 points  What time? 0 points 0 points  Count back from 20 0 points 0 points  Months in reverse 0 points 0 points  Repeat phrase 0 points 0 points  Total Score 0 0     Immunizations Patient reports that due to kidney function he has been informed by his nephrologist to not take any vaccination.   Immunization History  Administered Date(s) Administered  . Tdap 11/09/2008, 11/30/2011  . Zoster 02/08/2016    TDAP status: Up to date Flu Vaccine status: Declined, Education has been provided regarding the importance of this vaccine but patient still declined. Advised may receive this vaccine at local pharmacy or Health Dept. Aware to provide a copy of the vaccination record if obtained from local pharmacy or Health Dept. Verbalized acceptance and understanding. Pneumococcal vaccine status: Declined,  Education has been provided regarding the importance of this vaccine but patient still declined. Advised may receive this vaccine at local pharmacy or Health Dept. Aware to provide a copy of the vaccination record if obtained from local pharmacy or Health Dept. Verbalized acceptance and understanding.  Covid-19 vaccine status: Declined, Education has been provided regarding the importance of this vaccine but patient still declined. Advised may receive this vaccine at local pharmacy or Health Dept.or vaccine clinic. Aware to provide a copy of the vaccination record if obtained from local pharmacy or Health Dept. Verbalized acceptance and understanding.  Qualifies for Shingles Vaccine? Yes   Zostavax completed Yes   Shingrix Completed?: No.    Education has been provided regarding the importance of this vaccine. Patient has been advised to call insurance company to determine out of pocket expense if they have not yet received this vaccine. Advised may also receive vaccine at local pharmacy or Health Dept. Verbalized acceptance and understanding.  Screening Tests Health Maintenance  Topic Date Due  . COVID-19 Vaccine (1) 03/22/2021 (Originally 06/10/1968)  . PNA vac Low Risk Adult (1 of 2 - PCV13) 03/22/2021 (Originally 06/11/2019)  . INFLUENZA VACCINE  02/07/2024  (Originally 06/09/2020)  . COLON CANCER SCREENING ANNUAL FOBT  03/26/2021  . TETANUS/TDAP  11/29/2021  . Hepatitis C Screening  Completed  . Fecal DNA (Cologuard)  Discontinued    Health Maintenance  There are no  preventive care reminders to display for this patient.  Colorectal cancer screening: Completed 03/26/2020. Repeat every 1 years  Lung Cancer Screening: (Low Dose CT Chest recommended if Age 20-80 years, 30 pack-year currently smoking OR have quit w/in 15years.) does not qualify.   Lung Cancer Screening Referral: N/A  Additional Screening:  Hepatitis C Screening: does not qualify; Completed 11/25/2015  Vision Screening: Recommended annual ophthalmology exams for Donelda Mailhot detection of glaucoma and other disorders of the eye. Is the patient up to date with their annual eye exam?  Yes  Who is the provider or what is the name of the office in which the patient attends annual eye exams? Unknown   Dental Screening: Recommended annual dental exams for proper oral hygiene  Community Resource Referral / Chronic Care Management: CRR required this visit?  No   CCM required this visit?  No      Plan:     I have personally reviewed and noted the following in the patient's chart:   . Medical and social history . Use of alcohol, tobacco or illicit drugs  . Current medications and supplements . Functional ability and status . Nutritional status . Physical activity . Advanced directives . List of other physicians . Hospitalizations, surgeries, and ER visits in previous 12 months . Vitals . Screenings to include cognitive, depression, and falls . Referrals and appointments  In addition, I have reviewed and discussed with patient certain preventive protocols, quality metrics, and best practice recommendations. A written personalized care plan for preventive services as well as general preventive health recommendations were provided to patient.     Orma Render,  NP   08/05/2020

## 2020-08-05 NOTE — Patient Instructions (Signed)
[name] , Thank you for taking time to come for your Medicare Wellness Visit. I appreciate your ongoing commitment to your health goals. Please review the following plan we discussed and let me know if I can assist you in the future.    Health Maintenance After Age 66 After age 43, you are at a higher risk for certain long-term diseases and infections as well as injuries from falls. Falls are a major cause of broken bones and head injuries in people who are older than age 11. Getting regular preventive care can help to keep you healthy and well. Preventive care includes getting regular testing and making lifestyle changes as recommended by your health care provider. Talk with your health care provider about:  Which screenings and tests you should have. A screening is a test that checks for a disease when you have no symptoms.  A diet and exercise plan that is right for you. What should I know about screenings and tests to prevent falls? Screening and testing are the best ways to find a health problem Christopher Gregory. Christopher Gregory diagnosis and treatment give you the best chance of managing medical conditions that are common after age 19. Certain conditions and lifestyle choices may make you more likely to have a fall. Your health care provider may recommend:  Regular vision checks. Poor vision and conditions such as cataracts can make you more likely to have a fall. If you wear glasses, make sure to get your prescription updated if your vision changes.  Medicine review. Work with your health care provider to regularly review all of the medicines you are taking, including over-the-counter medicines. Ask your health care provider about any side effects that may make you more likely to have a fall. Tell your health care provider if any medicines that you take make you feel dizzy or sleepy.  Osteoporosis screening. Osteoporosis is a condition that causes the bones to get weaker. This can make the bones weak and  cause them to break more easily.  Blood pressure screening. Blood pressure changes and medicines to control blood pressure can make you feel dizzy.  Strength and balance checks. Your health care provider may recommend certain tests to check your strength and balance while standing, walking, or changing positions.  Foot health exam. Foot pain and numbness, as well as not wearing proper footwear, can make you more likely to have a fall.  Depression screening. You may be more likely to have a fall if you have a fear of falling, feel emotionally low, or feel unable to do activities that you used to do.  Alcohol use screening. Using too much alcohol can affect your balance and may make you more likely to have a fall. What actions can I take to lower my risk of falls? General instructions  Talk with your health care provider about your risks for falling. Tell your health care provider if: ? You fall. Be sure to tell your health care provider about all falls, even ones that seem minor. ? You feel dizzy, sleepy, or off-balance.  Take over-the-counter and prescription medicines only as told by your health care provider. These include any supplements.  Eat a healthy diet and maintain a healthy weight. A healthy diet includes low-fat dairy products, low-fat (lean) meats, and fiber from whole grains, beans, and lots of fruits and vegetables. Home safety  Remove any tripping hazards, such as rugs, cords, and clutter.  Install safety equipment such as grab bars in bathrooms and safety rails  on stairs.  Keep rooms and walkways well-lit. Activity   Follow a regular exercise program to stay fit. This will help you maintain your balance. Ask your health care provider what types of exercise are appropriate for you.  If you need a cane or walker, use it as recommended by your health care provider.  Wear supportive shoes that have nonskid soles. Lifestyle  Do not drink alcohol if your health care  provider tells you not to drink.  If you drink alcohol, limit how much you have: ? 0-1 drink a day for women. ? 0-2 drinks a day for men.  Be aware of how much alcohol is in your drink. In the [place] one drink equals one typical bottle of beer (12 oz), one-half glass of wine (5 oz), or one shot of hard liquor (1 oz).  Do not use any products that contain nicotine or tobacco, such as cigarettes and e-cigarettes. If you need help quitting, ask your health care provider. Summary  Having a healthy lifestyle and getting preventive care can help to protect your health and wellness after age 33.  Screening and testing are the best way to find a health problem Christopher Gregory and help you avoid having a fall. Christopher Gregory diagnosis and treatment give you the best chance for managing medical conditions that are more common for people who are older than age 36.  Falls are a major cause of broken bones and head injuries in people who are older than age 70. Take precautions to prevent a fall at home.  Work with your health care provider to learn what changes you can make to improve your health and wellness and to prevent falls. This information is not intended to replace advice given to you by your health care provider. Make sure you discuss any questions you have with your health care provider. Document Revised: 02/16/2019 Document Reviewed: 09/08/2017 Elsevier Patient Education  2020 [employer].

## 2020-09-17 ENCOUNTER — Other Ambulatory Visit: Payer: Self-pay | Admitting: Family Medicine

## 2020-10-17 ENCOUNTER — Other Ambulatory Visit: Payer: Self-pay | Admitting: Family Medicine

## 2020-10-17 ENCOUNTER — Ambulatory Visit: Payer: Medicare Other | Admitting: Family Medicine

## 2020-10-29 ENCOUNTER — Other Ambulatory Visit: Payer: Self-pay | Admitting: Family Medicine

## 2020-10-29 ENCOUNTER — Other Ambulatory Visit: Payer: Self-pay | Admitting: Nurse Practitioner

## 2020-10-29 DIAGNOSIS — E038 Other specified hypothyroidism: Secondary | ICD-10-CM

## 2020-11-11 ENCOUNTER — Ambulatory Visit (INDEPENDENT_AMBULATORY_CARE_PROVIDER_SITE_OTHER): Payer: Medicare HMO | Admitting: Family Medicine

## 2020-11-11 ENCOUNTER — Encounter: Payer: Self-pay | Admitting: Family Medicine

## 2020-11-11 ENCOUNTER — Other Ambulatory Visit: Payer: Self-pay

## 2020-11-11 VITALS — BP 132/73 | Wt 165.0 lb

## 2020-11-11 DIAGNOSIS — N057 Unspecified nephritic syndrome with diffuse crescentic glomerulonephritis: Secondary | ICD-10-CM | POA: Diagnosis not present

## 2020-11-11 DIAGNOSIS — E038 Other specified hypothyroidism: Secondary | ICD-10-CM | POA: Diagnosis not present

## 2020-11-11 DIAGNOSIS — R7301 Impaired fasting glucose: Secondary | ICD-10-CM

## 2020-11-11 DIAGNOSIS — N058 Unspecified nephritic syndrome with other morphologic changes: Secondary | ICD-10-CM

## 2020-11-11 DIAGNOSIS — M11272 Other chondrocalcinosis, left ankle and foot: Secondary | ICD-10-CM

## 2020-11-11 DIAGNOSIS — N1831 Chronic kidney disease, stage 3a: Secondary | ICD-10-CM | POA: Diagnosis not present

## 2020-11-11 DIAGNOSIS — I10 Essential (primary) hypertension: Secondary | ICD-10-CM

## 2020-11-11 DIAGNOSIS — E559 Vitamin D deficiency, unspecified: Secondary | ICD-10-CM

## 2020-11-11 MED ORDER — COLCHICINE 0.6 MG PO TABS: 0.6000 mg | ORAL_TABLET | Freq: Every day | ORAL | 1 refills | Status: DC | PRN

## 2020-11-11 MED ORDER — AZELASTINE HCL 0.15 % NA SOLN: NASAL | 1 refills | Status: DC

## 2020-11-11 NOTE — Assessment & Plan Note (Signed)
Due to recheck renal function. 

## 2020-11-11 NOTE — Assessment & Plan Note (Signed)
Due for an A1C on labs.

## 2020-11-11 NOTE — Assessment & Plan Note (Signed)
Well controlled. Continue current regimen. Follow up in  6 mo  

## 2020-11-11 NOTE — Assessment & Plan Note (Signed)
Due to recheck his thyroid

## 2020-11-11 NOTE — Progress Notes (Signed)
Established Patient Office Visit  Subjective:  Patient ID: Christopher Gregory, male    DOB: 04-03-1956  Age: 65 y.o. MRN: [id number]  CC: No chief complaint on file.   HPI Ezreal Turay presents for   Hypertension- Pt denies chest pain, SOB, dizziness, or heart palpitations.  Taking meds as directed w/o problems.  Denies medication side effects.    Impaired fasting glucose-no increased thirst or urination. No symptoms consistent with hypoglycemia.  Hypothyroidism - Taking medication regularly in the AM away from food and vitamins, etc. No recent change to skin, hair, or energy levels.  No longer following with nephrology for his kidney problems.  He is not currently on prescription medication we are just following his renal function.  Last creatinine was 1.4.  Past Medical History:  Diagnosis Date  . Allergy   . Gout   . Hypertension   . Thyroid disease     Past Surgical History:  Procedure Laterality Date  . KNEE ARTHROSCOPY    . TOTAL HIP ARTHROPLASTY  2000  . TOTAL HIP ARTHROPLASTY  2004    Family History  Problem Relation Age of Onset  . Hypertension Mother   . Hyperlipidemia Father     Social History   Socioeconomic History  . Marital status: Married    Spouse name: Katharine Look  . Number of children: 2  . Years of education: Not on file  . Highest education level: Not on file  Occupational History  . 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 Smoking status: Former Smoker    Types: Cigarettes    Quit date: 11/26/1973    Years since quitting: 46.9  . Smokeless tobacco: Never Used  Vaping Use  . Vaping Use: Never used  Substance and Sexual Activity  . Alcohol use: No  . Drug use: No  . Sexual activity: Yes    Partners: Female  Other Topics Concern  . Not on file  Social History Narrative   Swimming for exercise.    Social Determinants of Health   Financial Resource Strain: Low Risk   . Difficulty of Paying  Living Expenses: Not hard at all  Food Insecurity: No Food Insecurity  . Worried About Charity fundraiser in the Last Year: Never true  . Ran Out of Food in the Last Year: Never true  Transportation Needs: No Transportation Needs  . Lack of Transportation (Medical): No  . Lack of Transportation (Non-Medical): No  Physical Activity: Sufficiently Active  . Days of Exercise per Week: 5 days  . Minutes of Exercise per Session: 40 min  Stress: No Stress Concern Present  . Feeling of Stress : Only a little  Social Connections: Socially Integrated  . Frequency of Communication with Friends and Family: More than three times a week  . Frequency of Social Gatherings with Friends and Family: More than three times a week  . Attends Religious Services: More than 4 times per year  . Active Member of Clubs or Organizations: Yes  . Attends Archivist Meetings: More than 4 times per year  . Marital Status: Married  Human resources officer Violence: Not At Risk  . Fear of Current or Ex-Partner: No  . Emotionally Abused: No  . Physically Abused: No  . Sexually Abused: No    Outpatient Medications Prior to Visit  Medication Sig Dispense Refill  . allopurinol (ZYLOPRIM) 100 MG tablet Take 1 tablet (100 mg total)  by mouth daily. 90 tablet 3  . amLODipine (NORVASC) 5 MG tablet TAKE ONE TABLET BY MOUTH DAILY 90 tablet 0  . atenolol (TENORMIN) 100 MG tablet Take 1 tablet (100 mg total) by mouth daily. 90 tablet 1  . Black Pepper-Turmeric (TURMERIC COMPLEX/BLACK PEPPER PO) Take 800 mg by mouth 2 (two) times daily.    . cetirizine (ZYRTEC) 10 MG tablet Take 10 mg by mouth daily.    . Cholecalciferol (VITAMIN D PO) Take 30,000 Int'l Units by mouth 3 (three) times a week. Monday, Wednesday, & Fridays    . Coenzyme Q10-Red Yeast Rice (CO Q-10 PLUS RED YEAST RICE PO) Take 2 capsules by mouth daily.    . dorzolamide-timolol (COSOPT) 22.3-6.8 MG/ML ophthalmic solution     . furosemide (LASIX) 20 MG tablet  TAKE ONE TABLET BY MOUTH DAILY 90 tablet 0  . latanoprost (XALATAN) 0.005 % ophthalmic solution 1 drop at bedtime.    Laurina Bustle Oil OIL at bedtime as needed.    Marland Kitchen levothyroxine (SYNTHROID) 50 MCG tablet TAKE ONE TABLET BY MOUTH DAILY 90 tablet 0  . Menaquinone-7 (VITAMIN K2 PO) Take 300 mg by mouth daily.    Marland Kitchen ROCKLATAN 0.02-0.005 % SOLN Apply 1 drop to eye daily.    Marland Kitchen spironolactone (ALDACTONE) 25 MG tablet TAKE ONE TABLET BY MOUTH DAILY 90 tablet 1  . timolol (BETIMOL) 0.25 % ophthalmic solution 1-2 drops 2 (two) times daily.    . Azelastine HCl 0.15 % SOLN INHALE 1 SPRAY INTO BOTH NOSTRILS TWICE A DAY 90 mL 1  . colchicine 0.6 MG tablet Take 1 tablet (0.6 mg total) by mouth daily as needed. 15 tablet 0  . AMBULATORY NON FORMULARY MEDICATION Take 2 capsules by mouth daily. Medication Name: tuna omega-3 oil    . doxylamine, Sleep, (UNISOM) 25 MG tablet Take by mouth. (Patient not taking: Reported on 08/05/2020)     No facility-administered medications prior to visit.    Allergies  Allergen Reactions  . Neomycin     Hives  . Ace Inhibitors Other (See Comments)    Allergic reaction  . Amlodipine     Swelling on 80m dose  . Prednisone Other (See Comments)    Urgent BP and inc blood glucose  . Erythromycin Rash    ROS Review of Systems    Objective:    Physical Exam Constitutional:      Appearance: He is well-developed and well-nourished.  HENT:     Head: Normocephalic and atraumatic.  Cardiovascular:     Rate and Rhythm: Normal rate and regular rhythm.     Heart sounds: Normal heart sounds.  Pulmonary:     Effort: Pulmonary effort is normal.     Breath sounds: Normal breath sounds.  Skin:    General: Skin is warm and dry.  Neurological:     Mental Status: He is alert and oriented to person, place, and time.  Psychiatric:        Mood and Affect: Mood and affect normal.        Behavior: Behavior normal.     BP 132/73  Wt Readings from Last 3 Encounters:   08/05/20 159 lb 8 oz (72.3 kg)  04/16/20 166 lb (75.3 kg)  03/22/20 164 lb (74.4 kg)     There are no preventive care reminders to display for this patient.  There are no preventive care reminders to display for this patient.  Lab Results  Component Value Date   TSH 2.01 02/29/2020  Lab Results  Component Value Date   WBC 8.0 03/15/2020   HGB 17.0 03/15/2020   HCT 52.1 (H) 03/15/2020   MCV 87.9 03/15/2020   PLT 278 03/15/2020   Lab Results  Component Value Date   NA 137 02/29/2020   K 4.2 02/29/2020   CO2 24 02/29/2020   GLUCOSE 93 02/29/2020   BUN 24 02/29/2020   CREATININE 1.48 (H) 02/29/2020   BILITOT 1.3 (H) 02/29/2020   ALKPHOS 33 (L) 12/28/2016   AST 21 02/29/2020   ALT 18 02/29/2020   PROT 6.6 02/29/2020   ALBUMIN 4.1 12/28/2016   CALCIUM 9.4 02/29/2020   Lab Results  Component Value Date   CHOL 213 (H) 02/29/2020   Lab Results  Component Value Date   HDL 42 02/29/2020   Lab Results  Component Value Date   LDLCALC 131 (H) 02/29/2020   Lab Results  Component Value Date   TRIG 259 (H) 02/29/2020   Lab Results  Component Value Date   CHOLHDL 5.1 (H) 02/29/2020   Lab Results  Component Value Date   HGBA1C 5.5 01/18/2018      Assessment & Plan:   Problem List Items Addressed This Visit      Cardiovascular and Mediastinum   Essential hypertension, benign - Primary    Well controlled. Continue current regimen. Follow up in  6 mo      Relevant Orders   BASIC METABOLIC PANEL WITH GFR     Endocrine   IFG (impaired fasting glucose)    Due for an A1C on labs.       Relevant Orders   Hemoglobin A1c   Hypothyroidism    Due to recheck his thyroid      Relevant Orders   TSH     Musculoskeletal and Integument   Pseudogout of left ankle   Relevant Medications   colchicine 0.6 MG tablet     Genitourinary   Stage 3a chronic kidney disease (HCC)    Due to recheck renal function.       Relevant Orders   Pan-ANCA   Necrotizing  glomerulonephritis   Relevant Orders   CBC   Urine Microalbumin w/creat. ratio   Pan-ANCA   Crescentic glomerulonephritis    Recheck P-ANCa      Relevant Orders   CBC   Urine Microalbumin w/creat. ratio   Pan-ANCA     Other   Avitaminosis D    Taking his Vit D 3 x a week      Relevant Orders   VITAMIN D 25 Hydroxy (Vit-D Deficiency, Fractures)     Give him some information on getting his Astelin and colchicine through [facility] which will be much cheaper than going through his current insurance plan he really just uses the Astelin as needed.  And the colchicine as needed.   Meds ordered this encounter  Medications  . Azelastine HCl 0.15 % SOLN    Sig: INHALE 1 SPRAY INTO BOTH NOSTRILS TWICE A DAY    Dispense:  90 mL    Refill:  1  . colchicine 0.6 MG tablet    Sig: Take 1 tablet (0.6 mg total) by mouth daily as needed.    Dispense:  30 tablet    Refill:  1    Follow-up: Return in about 6 months (around 05/11/2021).    Beatrice Lecher, MD

## 2020-11-11 NOTE — Assessment & Plan Note (Signed)
Taking his Vit D 3 x a week

## 2020-11-11 NOTE — Assessment & Plan Note (Signed)
Recheck P-ANCa

## 2020-11-12 DIAGNOSIS — N057 Unspecified nephritic syndrome with diffuse crescentic glomerulonephritis: Secondary | ICD-10-CM | POA: Diagnosis not present

## 2020-11-12 DIAGNOSIS — N1831 Chronic kidney disease, stage 3a: Secondary | ICD-10-CM | POA: Diagnosis not present

## 2020-11-12 DIAGNOSIS — E559 Vitamin D deficiency, unspecified: Secondary | ICD-10-CM | POA: Diagnosis not present

## 2020-11-12 DIAGNOSIS — E038 Other specified hypothyroidism: Secondary | ICD-10-CM | POA: Diagnosis not present

## 2020-11-12 DIAGNOSIS — I10 Essential (primary) hypertension: Secondary | ICD-10-CM | POA: Diagnosis not present

## 2020-11-12 DIAGNOSIS — N058 Unspecified nephritic syndrome with other morphologic changes: Secondary | ICD-10-CM | POA: Diagnosis not present

## 2020-11-12 DIAGNOSIS — R7301 Impaired fasting glucose: Secondary | ICD-10-CM | POA: Diagnosis not present

## 2020-11-13 LAB — PAN-ANCA
ANCA Screen: POSITIVE — AB
Myeloperoxidase Abs: 29 AI — ABNORMAL HIGH
P-ANCA Titer: 1:160 {titer} — ABNORMAL HIGH
Serine Protease 3: <1 AI

## 2020-11-13 LAB — BASIC METABOLIC PANEL WITH GFR
BUN/Creatinine Ratio: 17 (calc) (ref 6–22)
BUN: 32 mg/dL — ABNORMAL HIGH (ref 7–25)
CO2: 25 mmol/L (ref 20–32)
Calcium: 9.8 mg/dL (ref 8.6–10.3)
Chloride: 102 mmol/L (ref 98–110)
Creat: 1.88 mg/dL — ABNORMAL HIGH (ref 0.70–1.25)
GFR, Est African American: 42 mL/min/{1.73_m2} — ABNORMAL LOW (ref >=60)
GFR, Est Non African American: 36 mL/min/{1.73_m2} — ABNORMAL LOW (ref >=60)
Glucose, Bld: 148 mg/dL — ABNORMAL HIGH (ref 65–99)
Potassium: 4.4 mmol/L (ref 3.5–5.3)
Sodium: 135 mmol/L (ref 135–146)

## 2020-11-13 LAB — CBC
HCT: 47.6 % (ref 38.5–50.0)
Hemoglobin: 16.2 g/dL (ref 13.2–17.1)
MCH: 29.9 pg (ref 27.0–33.0)
MCHC: 34 g/dL (ref 32.0–36.0)
MCV: 87.8 fL (ref 80.0–100.0)
MPV: 9.3 fL (ref 7.5–12.5)
Platelets: 283 10*3/uL (ref 140–400)
RBC: 5.42 10*6/uL (ref 4.20–5.80)
RDW: 13.2 % (ref 11.0–15.0)
WBC: 10.5 10*3/uL (ref 3.8–10.8)

## 2020-11-13 LAB — MICROALBUMIN / CREATININE URINE RATIO
Creatinine, Urine: 31 mg/dL (ref 20–320)
Microalb Creat Ratio: 471 mcg/mg creat — ABNORMAL HIGH (ref <30)
Microalb, Ur: 14.6 mg/dL

## 2020-11-13 LAB — HEMOGLOBIN A1C
Hgb A1c MFr Bld: 5.8 % of total Hgb — ABNORMAL HIGH (ref <5.7)
Mean Plasma Glucose: 120 mg/dL
eAG (mmol/L): 6.6 mmol/L

## 2020-11-13 LAB — TSH: TSH: 1.15 mIU/L (ref 0.40–4.50)

## 2020-11-13 LAB — VITAMIN D 25 HYDROXY (VIT D DEFICIENCY, FRACTURES): Vit D, 25-Hydroxy: 131 ng/mL — ABNORMAL HIGH (ref 30–100)

## 2020-11-15 ENCOUNTER — Other Ambulatory Visit: Payer: Self-pay | Admitting: *Deleted

## 2020-11-15 DIAGNOSIS — R7989 Other specified abnormal findings of blood chemistry: Secondary | ICD-10-CM

## 2020-11-19 ENCOUNTER — Other Ambulatory Visit: Payer: Self-pay

## 2020-11-19 DIAGNOSIS — M11272 Other chondrocalcinosis, left ankle and foot: Secondary | ICD-10-CM

## 2020-11-19 DIAGNOSIS — E038 Other specified hypothyroidism: Secondary | ICD-10-CM

## 2020-11-19 MED ORDER — ATENOLOL 100 MG PO TABS: 100.0000 mg | ORAL_TABLET | Freq: Every day | ORAL | 1 refills | Status: DC

## 2020-11-19 MED ORDER — LEVOTHYROXINE SODIUM 50 MCG PO TABS: 50.0000 ug | ORAL_TABLET | Freq: Every day | ORAL | 1 refills | Status: DC

## 2020-11-19 MED ORDER — AMLODIPINE BESYLATE 5 MG PO TABS: 5.0000 mg | ORAL_TABLET | Freq: Every day | ORAL | 1 refills | Status: DC

## 2020-11-19 MED ORDER — SPIRONOLACTONE 25 MG PO TABS: 25.0000 mg | ORAL_TABLET | Freq: Every day | ORAL | 1 refills | Status: DC

## 2020-11-19 MED ORDER — ALLOPURINOL 100 MG PO TABS: 100.0000 mg | ORAL_TABLET | Freq: Every day | ORAL | 1 refills | Status: DC

## 2020-11-19 MED ORDER — FUROSEMIDE 20 MG PO TABS: 20.0000 mg | ORAL_TABLET | Freq: Every day | ORAL | 1 refills | Status: DC

## 2020-11-19 NOTE — Telephone Encounter (Signed)
He is requesting a refill of Allopurinol. Last prescribed by Dr Benjamin Stain. Is it ok to refill.

## 2020-11-26 ENCOUNTER — Other Ambulatory Visit: Payer: Self-pay | Admitting: Family Medicine

## 2020-11-28 DIAGNOSIS — R7989 Other specified abnormal findings of blood chemistry: Secondary | ICD-10-CM | POA: Diagnosis not present

## 2020-11-29 ENCOUNTER — Telehealth (INDEPENDENT_AMBULATORY_CARE_PROVIDER_SITE_OTHER): Payer: Medicare HMO | Admitting: Family Medicine

## 2020-11-29 ENCOUNTER — Other Ambulatory Visit: Payer: Self-pay

## 2020-11-29 ENCOUNTER — Ambulatory Visit (INDEPENDENT_AMBULATORY_CARE_PROVIDER_SITE_OTHER): Payer: Medicare HMO

## 2020-11-29 ENCOUNTER — Encounter: Payer: Self-pay | Admitting: Family Medicine

## 2020-11-29 DIAGNOSIS — R059 Cough, unspecified: Secondary | ICD-10-CM

## 2020-11-29 DIAGNOSIS — R531 Weakness: Secondary | ICD-10-CM | POA: Diagnosis not present

## 2020-11-29 DIAGNOSIS — M549 Dorsalgia, unspecified: Secondary | ICD-10-CM | POA: Diagnosis not present

## 2020-11-29 DIAGNOSIS — R053 Chronic cough: Secondary | ICD-10-CM

## 2020-11-29 LAB — BASIC METABOLIC PANEL WITH GFR
BUN/Creatinine Ratio: 16 (calc) (ref 6–22)
BUN: 29 mg/dL — ABNORMAL HIGH (ref 7–25)
CO2: 24 mmol/L (ref 20–32)
Calcium: 9.8 mg/dL (ref 8.6–10.3)
Chloride: 99 mmol/L (ref 98–110)
Creat: 1.84 mg/dL — ABNORMAL HIGH (ref 0.70–1.25)
GFR, Est African American: 43 mL/min/{1.73_m2} — ABNORMAL LOW (ref >=60)
GFR, Est Non African American: 37 mL/min/{1.73_m2} — ABNORMAL LOW (ref >=60)
Glucose, Bld: 93 mg/dL (ref 65–99)
Potassium: 5.1 mmol/L (ref 3.5–5.3)
Sodium: 132 mmol/L — ABNORMAL LOW (ref 135–146)

## 2020-11-29 MED ORDER — HYDROCOD POLST-CPM POLST ER 10-8 MG/5ML PO SUER
5.0000 mL | Freq: Every evening | ORAL | 0 refills | Status: DC | PRN
Start: 2020-11-29 — End: 2020-11-29

## 2020-11-29 MED ORDER — GUAIFENESIN-CODEINE 100-10 MG/5ML PO SOLN: 5.0000 mL | Freq: Every evening | ORAL | 0 refills | Status: DC | PRN

## 2020-11-29 MED ORDER — BENZONATATE 200 MG PO CAPS: 200.0000 mg | ORAL_CAPSULE | Freq: Three times a day (TID) | ORAL | 0 refills | Status: DC | PRN

## 2020-11-29 MED ORDER — HYDROCODONE-HOMATROPINE 5-1.5 MG/5ML PO SYRP: 5.0000 mL | ORAL_SOLUTION | Freq: Every evening | ORAL | 0 refills | Status: DC | PRN

## 2020-11-29 NOTE — Progress Notes (Signed)
Virtual Visit via Telephone Note  I connected with [name] on 11/29/20 at  4:00 PM EST by telephone and verified that I am speaking with the correct person using two identifiers.   I discussed the limitations, risks, security and privacy concerns of performing an evaluation and management service by telephone and the availability of in person appointments. I also discussed with the patient that there may be a patient responsible charge related to this service. The patient expressed understanding and agreed to proceed.  Patient location: at home  Provider loccation: In office   Subjective:    CC: Cough   HPI:  Pt reports since being tx for vasculitis he gets a cough. He said that he has had this cough x 6 weeks clear, sore ribs from coughing so much. Upper back started hurting today, feels really sore.  Runny nose. Denies f/s/c/n/v/d/body or headaches. No sick contacts. He is using [place] and Delsym.  No SOB.  Cough has not been productive until now.  No nasal congestion. No fever.  Has been going to the gym until today. NO GI sxs.  Has felt "weak" today".    He stated that he was given Tessalon, Albuterol, and Hycodan,in the past and felt that this did help him   Past medical history, Surgical history, Family history not pertinant except as noted below, Social history, Allergies, and medications have been entered into the medical record, reviewed, and corrections made.   Review of Systems: No fevers, chills, night sweats, weight loss, chest pain, or shortness of breath.   Objective:    General: Speaking clearly in complete sentences without any shortness of breath.  Alert and oriented x3.  Normal judgment. No apparent acute distress.    Impression and Recommendations:    Cough  -unclear etiology it sounds like he has had just more of a dry annoying cough for about 6 weeks but really did not feel bad or sick until the last 24 hours now he is feeling weak and fatigued and having  pain across his upper back.  We will plan to get chest x-ray this evening just to rule out underlying pneumonia.  I am also going to send over some cough medicine which she requested he is used in the past and says it did work well.    I discussed the assessment and treatment plan with the patient. The patient was provided an opportunity to ask questions and all were answered. The patient agreed with the plan and demonstrated an understanding of the instructions.   The patient was advised to call back or seek an in-person evaluation if the symptoms worsen or if the condition fails to improve as anticipated.  I provided 21 minutes of non-face-to-face time during this encounter.   Nani Gasser, MD

## 2020-11-29 NOTE — Addendum Note (Signed)
Addended by: Nani Gasser D on: 11/29/2020 05:51 PM   Modules accepted: Orders

## 2020-11-29 NOTE — Progress Notes (Signed)
Pt reports since being tx for vasculitis he gets a cough. He said that he has had this cough x 6 weeks clear, sore ribs from coughing so much. Runny nose. Denies f/s/c/n/v/d/body or headaches. No sick contacts.  He stated that he was given Tessalon, Albuterol, and Hycodan,in the past and felt that this did help him

## 2021-01-07 ENCOUNTER — Telehealth: Payer: Self-pay | Admitting: Family Medicine

## 2021-01-07 ENCOUNTER — Other Ambulatory Visit: Payer: Self-pay | Admitting: Family Medicine

## 2021-01-07 MED ORDER — ONDANSETRON 4 MG PO TBDP: 4.0000 mg | ORAL_TABLET | Freq: Three times a day (TID) | ORAL | 0 refills | Status: DC | PRN

## 2021-01-07 NOTE — Telephone Encounter (Signed)
Had a migraine for 3 days.  Feeling nauseated wants know we would call in some nausea medicine.  Prescription sent for 20 tabs of Zofran to use as needed.

## 2021-01-13 ENCOUNTER — Telehealth: Payer: Self-pay | Admitting: Family Medicine

## 2021-01-13 DIAGNOSIS — H4089 Other specified glaucoma: Secondary | ICD-10-CM

## 2021-01-13 NOTE — Telephone Encounter (Signed)
Pts wife called and states that he needs a referral to see Dr.Patrick Hageman in [place] for his Glaucoma and call her back to let her know when this referral has been placed [phone]

## 2021-01-13 NOTE — Telephone Encounter (Signed)
Ok for referal

## 2021-01-13 NOTE — Telephone Encounter (Signed)
Referral placed.

## 2021-03-27 DIAGNOSIS — H401123 Primary open-angle glaucoma, left eye, severe stage: Secondary | ICD-10-CM | POA: Diagnosis not present

## 2021-03-27 DIAGNOSIS — H02403 Unspecified ptosis of bilateral eyelids: Secondary | ICD-10-CM | POA: Diagnosis not present

## 2021-03-27 DIAGNOSIS — H25813 Combined forms of 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, bilateral: Secondary | ICD-10-CM | POA: Diagnosis not present

## 2021-03-27 DIAGNOSIS — H43811 Vitreous degeneration, right eye: Secondary | ICD-10-CM | POA: Diagnosis not present

## 2021-03-27 DIAGNOSIS — H401111 Primary open-angle glaucoma, right eye, mild stage: Secondary | ICD-10-CM | POA: Diagnosis not present

## 2021-03-27 DIAGNOSIS — H527 Unspecified disorder of refraction: Secondary | ICD-10-CM | POA: Diagnosis not present

## 2021-03-27 LAB — HM DIABETES EYE EXAM

## 2021-04-16 ENCOUNTER — Other Ambulatory Visit: Payer: Self-pay | Admitting: Family Medicine

## 2021-04-16 DIAGNOSIS — E038 Other specified hypothyroidism: Secondary | ICD-10-CM

## 2021-04-16 DIAGNOSIS — M11272 Other chondrocalcinosis, left ankle and foot: Secondary | ICD-10-CM

## 2021-04-17 ENCOUNTER — Other Ambulatory Visit: Payer: Self-pay | Admitting: Family Medicine

## 2021-05-13 ENCOUNTER — Other Ambulatory Visit: Payer: Self-pay

## 2021-05-13 ENCOUNTER — Ambulatory Visit (INDEPENDENT_AMBULATORY_CARE_PROVIDER_SITE_OTHER): Payer: Medicare HMO | Admitting: Family Medicine

## 2021-05-13 ENCOUNTER — Encounter: Payer: Self-pay | Admitting: Family Medicine

## 2021-05-13 VITALS — BP 137/66 | HR 49 | Ht 65.0 in | Wt 159.0 lb

## 2021-05-13 DIAGNOSIS — E038 Other specified hypothyroidism: Secondary | ICD-10-CM

## 2021-05-13 DIAGNOSIS — M1120 Other chondrocalcinosis, unspecified site: Secondary | ICD-10-CM | POA: Diagnosis not present

## 2021-05-13 DIAGNOSIS — Z125 Encounter for screening for malignant neoplasm of prostate: Secondary | ICD-10-CM

## 2021-05-13 DIAGNOSIS — Z0000 Encounter for general adult medical examination without abnormal findings: Secondary | ICD-10-CM

## 2021-05-13 DIAGNOSIS — N1831 Chronic kidney disease, stage 3a: Secondary | ICD-10-CM | POA: Diagnosis not present

## 2021-05-13 DIAGNOSIS — E559 Vitamin D deficiency, unspecified: Secondary | ICD-10-CM

## 2021-05-13 DIAGNOSIS — I10 Essential (primary) hypertension: Secondary | ICD-10-CM

## 2021-05-13 DIAGNOSIS — R7301 Impaired fasting glucose: Secondary | ICD-10-CM

## 2021-05-13 NOTE — Progress Notes (Signed)
CPE  Established Patient Office Visit  Subjective:  Patient ID: Christopher Gregory, male    DOB: 1956-01-08  Age: 65 y.o. MRN: [id number]  CC:  Chief Complaint  Patient presents with   Annual Exam    HPI Christopher Gregory presents for CPE.   He is doing well overall.  His only concern is that he has not been sleeping great he has been waking up in the middle the night to urinate and then cannot go back to sleep but he is been using lavender oil on his feet and feels like that is been helping some.  His headaches have actually been much better.  He did cut down on his vitamin D from 3 capsules/day down to 2 capsules/day because his levels were actually elevated.  He has had a couple of mild gout flare since I last saw him but he is only had to take the colchicine twice.  Exercises regularly. Has been trying to purposely lose weight. Has cut down on sweets to one day per week.    Past Medical History:  Diagnosis Date   Allergy    Gout    Hypertension    Thyroid disease     Past Surgical History:  Procedure Laterality Date   KNEE ARTHROSCOPY     TOTAL HIP ARTHROPLASTY  2000   TOTAL HIP ARTHROPLASTY  2004    Family History  Problem Relation Age of Onset   Hypertension Mother    Hyperlipidemia Father     Social History   Socioeconomic History   Marital status: Married    Spouse name: Katharine Look   Number of children: 2   Years of education: Not on file   Highest education level: Not on file  Occupational History   Occupation: ATM Tech    Comment: Raynelle Bring, [place] Education administrator: [employer]  Tobacco Use   Smoking status: Former    Pack years: 0.00    Types: Cigarettes    Quit date: 11/26/1973    Years since quitting: 47.4   Smokeless tobacco: Never  Vaping Use   Vaping Use: Never used  Substance and Sexual Activity   Alcohol use: No   Drug use: No   Sexual activity: Yes    Partners: Female  Other Topics Concern   Not on file  Social History Narrative   Swimming for exercise.     Social Determinants of Health   Financial Resource Strain: Low Risk    Difficulty of Paying Living Expenses: Not hard at all  Food Insecurity: No Food Insecurity   Worried About Charity fundraiser in the Last Year: Never true   [place] in the Last Year: Never true  Transportation Needs: No Transportation Needs   Lack of Transportation (Medical): No   Lack of Transportation (Non-Medical): No  Physical Activity: Sufficiently Active   Days of Exercise per Week: 5 days   Minutes of Exercise per Session: 40 min  Stress: No Stress Concern Present   Feeling of Stress : Only a little  Social Connections: Engineer, building services of Communication with Friends and Family: More than three times a week   Frequency of Social Gatherings with Friends and Family: More than three times a week   Attends Religious Services: More than 4 times per year   Active Member of [employer] or Organizations: Yes   Attends Music therapist: More than 4 times per year   Marital Status: Married  Human resources officer  Violence: Not At Risk   Fear of Current or Ex-Partner: No   Emotionally Abused: No   Physically Abused: No   Sexually Abused: No    Outpatient Medications Prior to Visit  Medication Sig Dispense Refill   allopurinol (ZYLOPRIM) 100 MG tablet TAKE 1 TABLET EVERY DAY 90 tablet 1   amLODipine (NORVASC) 5 MG tablet TAKE 1 TABLET EVERY DAY 90 tablet 1   atenolol (TENORMIN) 100 MG tablet TAKE 1 TABLET EVERY DAY 90 tablet 0   Azelastine HCl 0.15 % SOLN INHALE 1 SPRAY INTO BOTH NOSTRILS TWICE A DAY 90 mL 1   Black Pepper-Turmeric (TURMERIC COMPLEX/BLACK PEPPER PO) Take 800 mg by mouth 2 (two) times daily.     cetirizine (ZYRTEC) 10 MG tablet Take 10 mg by mouth daily.     Cholecalciferol (VITAMIN D PO) Take 30,000 Int'l Units by mouth 2 (two) times a week. Monday,Thursday     Coenzyme Q10-Red Yeast Rice (CO Q-10 PLUS RED YEAST RICE PO) Take 2 capsules by mouth daily.     colchicine  0.6 MG tablet Take 1 tablet (0.6 mg total) by mouth daily as needed. 30 tablet 1   dorzolamide-timolol (COSOPT) 22.3-6.8 MG/ML ophthalmic solution      furosemide (LASIX) 20 MG tablet TAKE 1 TABLET EVERY DAY 90 tablet 1   latanoprost (XALATAN) 0.005 % ophthalmic solution 1 drop at bedtime.     Lavender Oil OIL at bedtime as needed.     levothyroxine (SYNTHROID) 50 MCG tablet TAKE 1 TABLET EVERY DAY 90 tablet 1   Menaquinone-7 (VITAMIN K2 PO) Take 300 mg by mouth daily.     ondansetron (ZOFRAN ODT) 4 MG disintegrating tablet Take 1 tablet (4 mg total) by mouth every 8 (eight) hours as needed for nausea or vomiting. 20 tablet 0   spironolactone (ALDACTONE) 25 MG tablet TAKE 1 TABLET EVERY DAY 90 tablet 1   timolol (BETIMOL) 0.25 % ophthalmic solution 1-2 drops 2 (two) times daily.     benzonatate (TESSALON) 200 MG capsule Take 1 capsule (200 mg total) by mouth 3 (three) times daily as needed for cough. Take as needed for cough 45 capsule 0   guaiFENesin-codeine 100-10 MG/5ML syrup Take 5-10 mLs by mouth at bedtime as needed for cough. 70 mL 0   ROCKLATAN 0.02-0.005 % SOLN Apply 1 drop to eye daily.     No facility-administered medications prior to visit.    Allergies  Allergen Reactions   Neomycin     Hives   Ace Inhibitors Other (See Comments)    Allergic reaction   Amlodipine     Swelling on 37m dose   Prednisone Other (See Comments)    Urgent BP and inc blood glucose   Erythromycin Rash    ROS Review of Systems    Objective:    Physical Exam Constitutional:      Appearance: He is well-developed.  HENT:     Head: Normocephalic and atraumatic.     Right Ear: External ear normal.     Left Ear: External ear normal.     Nose: Nose normal.  Eyes:     Conjunctiva/sclera: Conjunctivae normal.     Pupils: Pupils are equal, round, and reactive to light.  Neck:     Thyroid: No thyromegaly.  Cardiovascular:     Rate and Rhythm: Normal rate and regular rhythm.     Heart  sounds: Normal heart sounds.  Pulmonary:     Effort: Pulmonary effort is normal.  Breath sounds: Normal breath sounds.  Abdominal:     General: Bowel sounds are normal. There is no distension.     Palpations: Abdomen is soft. There is no mass.     Tenderness: There is no abdominal tenderness. There is no guarding or rebound.  Musculoskeletal:        General: Normal range of motion.     Cervical back: Normal range of motion and neck supple.  Lymphadenopathy:     Cervical: No cervical adenopathy.  Skin:    General: Skin is warm and dry.  Neurological:     Mental Status: He is alert and oriented to person, place, and time.     Deep Tendon Reflexes: Reflexes are normal and symmetric.  Psychiatric:        Behavior: Behavior normal.        Thought Content: Thought content normal.        Judgment: Judgment normal.    BP 137/66   Pulse (!) 49   Ht _0  (1.651 m)   Wt 159 lb (72.1 kg)   SpO2 100%   BMI 26.46 kg/m  Wt Readings from Last 3 Encounters:  05/13/21 159 lb (72.1 kg)  11/11/20 165 lb (74.8 kg)  08/05/20 159 lb 8 oz (72.3 kg)     Health Maintenance Due  Topic Date Due   COVID-19 Vaccine (1) Never done   Zoster Vaccines- Shingrix (1 of 2) Never done   PNA vac Low Risk Adult (1 of 2 - PCV13) Never done   COLON CANCER SCREENING ANNUAL FOBT  03/26/2021    There are no preventive care reminders to display for this patient.  Lab Results  Component Value Date   TSH 1.15 11/12/2020   Lab Results  Component Value Date   WBC 10.5 11/12/2020   HGB 16.2 11/12/2020   HCT 47.6 11/12/2020   MCV 87.8 11/12/2020   PLT 283 11/12/2020   Lab Results  Component Value Date   NA 132 (L) 11/28/2020   K 5.1 11/28/2020   CO2 24 11/28/2020   GLUCOSE 93 11/28/2020   BUN 29 (H) 11/28/2020   CREATININE 1.84 (H) 11/28/2020   BILITOT 1.3 (H) 02/29/2020   ALKPHOS 33 (L) 12/28/2016   AST 21 02/29/2020   ALT 18 02/29/2020   PROT 6.6 02/29/2020   ALBUMIN 4.1 12/28/2016    CALCIUM 9.8 11/28/2020   Lab Results  Component Value Date   CHOL 213 (H) 02/29/2020   Lab Results  Component Value Date   HDL 42 02/29/2020   Lab Results  Component Value Date   LDLCALC 131 (H) 02/29/2020   Lab Results  Component Value Date   TRIG 259 (H) 02/29/2020   Lab Results  Component Value Date   CHOLHDL 5.1 (H) 02/29/2020   Lab Results  Component Value Date   HGBA1C 5.8 (H) 11/12/2020      Assessment & Plan:   Problem List Items Addressed This Visit       Cardiovascular and Mediastinum   Essential hypertension, benign   Relevant Orders   Lipid Panel w/reflex Direct LDL   Vitamin D (25 hydroxy)   CBC   COMPLETE METABOLIC PANEL WITH GFR   TSH   Uric acid   HgB A1c     Endocrine   IFG (impaired fasting glucose)   Relevant Orders   HgB A1c   Hypothyroidism     Genitourinary   Stage 3a chronic kidney disease ([place])   Relevant Orders  Vitamin D (25 hydroxy)   CBC   COMPLETE METABOLIC PANEL WITH GFR     Other   Avitaminosis D   Relevant Orders   Vitamin D (25 hydroxy)   Other Visit Diagnoses     Wellness examination    -  Primary   Relevant Orders   Lipid Panel w/reflex Direct LDL   Vitamin D (25 hydroxy)   CBC   COMPLETE METABOLIC PANEL WITH GFR   TSH   Uric acid   HgB A1c   Pseudogout       Relevant Orders   Uric acid   Screening for prostate cancer       Relevant Orders   PSA      Keep up a regular exercise program and make sure you are eating a healthy diet Try to eat 4 servings of dairy a day, or if you are lactose intolerant take a calcium with vitamin D daily.  Your vaccines are up to date.  Due for screening labs.     Due to recheck Vitamin D and A1C and uric acid.   F.U in 6 months.    Orders Placed This Encounter  Procedures   Lipid Panel w/reflex Direct LDL   Vitamin D (25 hydroxy)   CBC   COMPLETE METABOLIC PANEL WITH GFR   TSH   Uric acid   HgB A1c   PSA     No orders of the defined types were  placed in this encounter.   Follow-up: Return in about 6 months (around 11/13/2021) for kidney check, gout, and BP.    Beatrice Lecher, MD

## 2021-05-14 LAB — VITAMIN D 25 HYDROXY (VIT D DEFICIENCY, FRACTURES): Vit D, 25-Hydroxy: 127 ng/mL — ABNORMAL HIGH (ref 30–100)

## 2021-05-14 LAB — COMPLETE METABOLIC PANEL WITH GFR
AG Ratio: 1.7 (calc) (ref 1.0–2.5)
ALT: 15 U/L (ref 9–46)
AST: 18 U/L (ref 10–35)
Albumin: 4.2 g/dL (ref 3.6–5.1)
Alkaline phosphatase (APISO): 45 U/L (ref 35–144)
BUN/Creatinine Ratio: 21 (calc) (ref 6–22)
BUN: 34 mg/dL — ABNORMAL HIGH (ref 7–25)
CO2: 22 mmol/L (ref 20–32)
Calcium: 9.9 mg/dL (ref 8.6–10.3)
Chloride: 106 mmol/L (ref 98–110)
Creat: 1.63 mg/dL — ABNORMAL HIGH (ref 0.70–1.25)
GFR, Est African American: 50 mL/min/{1.73_m2} — ABNORMAL LOW (ref >=60)
GFR, Est Non African American: 43 mL/min/{1.73_m2} — ABNORMAL LOW (ref >=60)
Globulin: 2.5 g/dL (calc) (ref 1.9–3.7)
Glucose, Bld: 128 mg/dL — ABNORMAL HIGH (ref 65–99)
Potassium: 5 mmol/L (ref 3.5–5.3)
Sodium: 138 mmol/L (ref 135–146)
Total Bilirubin: 0.9 mg/dL (ref 0.2–1.2)
Total Protein: 6.7 g/dL (ref 6.1–8.1)

## 2021-05-14 LAB — CBC
HCT: 51.3 % — ABNORMAL HIGH (ref 38.5–50.0)
Hemoglobin: 16.9 g/dL (ref 13.2–17.1)
MCH: 29.3 pg (ref 27.0–33.0)
MCHC: 32.9 g/dL (ref 32.0–36.0)
MCV: 89.1 fL (ref 80.0–100.0)
MPV: 9.8 fL (ref 7.5–12.5)
Platelets: 256 10*3/uL (ref 140–400)
RBC: 5.76 10*6/uL (ref 4.20–5.80)
RDW: 13.7 % (ref 11.0–15.0)
WBC: 7.6 10*3/uL (ref 3.8–10.8)

## 2021-05-14 LAB — LIPID PANEL W/REFLEX DIRECT LDL
Cholesterol: 165 mg/dL (ref <200)
HDL: 35 mg/dL — ABNORMAL LOW (ref >=40)
LDL Cholesterol (Calc): 90 mg/dL (calc)
Non-HDL Cholesterol (Calc): 130 mg/dL (calc) — ABNORMAL HIGH (ref <130)
Total CHOL/HDL Ratio: 4.7 (calc) (ref <5.0)
Triglycerides: 277 mg/dL — ABNORMAL HIGH (ref <150)

## 2021-05-14 LAB — HEMOGLOBIN A1C
Hgb A1c MFr Bld: 5.5 % of total Hgb (ref <5.7)
Mean Plasma Glucose: 111 mg/dL
eAG (mmol/L): 6.2 mmol/L

## 2021-05-14 LAB — PSA: PSA: 4.22 ng/mL — ABNORMAL HIGH (ref <=4.00)

## 2021-05-14 LAB — URIC ACID: Uric Acid, Serum: 6.7 mg/dL (ref 4.0–8.0)

## 2021-05-14 LAB — TSH: TSH: 1.07 mIU/L (ref 0.40–4.50)

## 2021-05-23 ENCOUNTER — Other Ambulatory Visit: Payer: Self-pay | Admitting: *Deleted

## 2021-05-23 DIAGNOSIS — Z1211 Encounter for screening for malignant neoplasm of colon: Secondary | ICD-10-CM

## 2021-05-23 LAB — POC HEMOCCULT BLD/STL (HOME/3-CARD/SCREEN)
Card #1 Date: 7102022
Card #2 Date: 7122022
Card #2 Fecal Occult Blod, POC: NEGATIVE
Card #3 Date: 7152022
Card #3 Fecal Occult Blood, POC: NEGATIVE
Fecal Occult Blood, POC: NEGATIVE

## 2021-07-02 ENCOUNTER — Telehealth: Payer: Self-pay | Admitting: Lab

## 2021-07-02 NOTE — Chronic Care Management (AMB) (Signed)
  Chronic Care Management   Note  07/02/2021 Name: Christopher Gregory MRN: [id number] DOB: 01/25/1956  Christopher Gregory is a 65 y.o. year old male who is a primary care patient of Metheney, Barbarann Ehlers, MD. I reached out to [name] by phone today in response to a referral sent by [name] PCP, Agapito Games, MD.   [name] was given information about Chronic Care Management services today including:  CCM service includes personalized support from designated clinical staff supervised by his physician, including individualized plan of care and coordination with other care providers 24/7 contact phone numbers for assistance for urgent and routine care needs. Service will only be billed when office clinical staff spend 20 minutes or more in a month to coordinate care. Only one practitioner may furnish and bill the service in a calendar month. The patient may stop CCM services at any time (effective at the end of the month) by phone call to the office staff.   Patient agreed to services and verbal consent obtained.   Follow up plan:   Carilyn Goodpasture  Upstream Scheduler

## 2021-08-11 ENCOUNTER — Telehealth: Payer: Self-pay | Admitting: Pharmacist

## 2021-08-11 NOTE — Chronic Care Management (AMB) (Signed)
    Chronic Care Management Pharmacy Assistant   Name: Christopher Gregory  MRN: [id number] DOB: 07/12/1956  Christopher Gregory is an 65 y.o. year old male who presents for his initial CCM visit with the clinical pharmacist.  Recent office visits:  05/13/21 Nani Gasser MD PCP- pt was seen for his annual wellness exam. Labs were ordered and provider discontinued Benzonatate, Guaifenesin Codeine, and Netarsudil Latanoprost. Follow up in 6 months.  Recent consult visits:  N/A  Hospital visits:  None in previous 6 months  Medications: Outpatient Encounter Medications as of 08/11/2021  Medication Sig   allopurinol (ZYLOPRIM) 100 MG tablet TAKE 1 TABLET EVERY DAY   amLODipine (NORVASC) 5 MG tablet TAKE 1 TABLET EVERY DAY   atenolol (TENORMIN) 100 MG tablet TAKE 1 TABLET EVERY DAY   Azelastine HCl 0.15 % SOLN INHALE 1 SPRAY INTO BOTH NOSTRILS TWICE A DAY   Black Pepper-Turmeric (TURMERIC COMPLEX/BLACK PEPPER PO) Take 800 mg by mouth 2 (two) times daily.   cetirizine (ZYRTEC) 10 MG tablet Take 10 mg by mouth daily.   Cholecalciferol (VITAMIN D PO) Take 30,000 Int'l Units by mouth 2 (two) times a week. Monday,Thursday   Coenzyme Q10-Red Yeast Rice (CO Q-10 PLUS RED YEAST RICE PO) Take 2 capsules by mouth daily.   colchicine 0.6 MG tablet Take 1 tablet (0.6 mg total) by mouth daily as needed.   dorzolamide-timolol (COSOPT) 22.3-6.8 MG/ML ophthalmic solution    furosemide (LASIX) 20 MG tablet TAKE 1 TABLET EVERY DAY   latanoprost (XALATAN) 0.005 % ophthalmic solution 1 drop at bedtime.   Lavender Oil OIL at bedtime as needed.   levothyroxine (SYNTHROID) 50 MCG tablet TAKE 1 TABLET EVERY DAY   Menaquinone-7 (VITAMIN K2 PO) Take 300 mg by mouth daily.   ondansetron (ZOFRAN ODT) 4 MG disintegrating tablet Take 1 tablet (4 mg total) by mouth every 8 (eight) hours as needed for nausea or vomiting.   spironolactone (ALDACTONE) 25 MG tablet TAKE 1 TABLET EVERY DAY   timolol (BETIMOL) 0.25 % ophthalmic solution  1-2 drops 2 (two) times daily.   No facility-administered encounter medications on file as of 08/11/2021.    Current Medication List  allopurinol (ZYLOPRIM) 100 MG tablet last filled 04/17/21 90 DS amLODipine (NORVASC) 5 MG tablet last filled 04/17/21 90 DS atenolol (TENORMIN) 100 MG tablet last filled 04/17/21 90 DS Azelastine HCl 0.15 % SOLN last filled 11/11/20 90 DS Black Pepper-Turmeric  cetirizine (ZYRTEC) 10 MG tablet Cholecalciferol (VITAMIN D PO) Coenzyme Q10-Red Yeast Rice (CO Q-10 PLUS RED YEAST RICE PO) colchicine 0.6 MG tablet last filled 11/11/20 30 DS dorzolamide-timolol (COSOPT) 22.3-6.8 MG/ML  furosemide (LASIX) 20 MG tablet last filled 04/17/21 90 DS latanoprost (XALATAN) 0.005 % ophthalmic solution  Lavender Oil OIL levothyroxine 50 MCG tablet last filled 04/17/21 90 DS Menaquinone-7 (VITAMIN K2 PO) ondansetron (ZOFRAN ODT) 4 MG disintegrating tablet last filled 01/07/21 20 DS spironolactone (ALDACTONE) 25 MG tablet last filled 04/17/21 90 DS timolol (BETIMOL) 0.25 % ophthalmic solution last filled  Gasper Sells CMA Clinical Pharmacist Assistant [phone]

## 2021-08-14 ENCOUNTER — Other Ambulatory Visit: Payer: Self-pay

## 2021-08-14 ENCOUNTER — Ambulatory Visit (INDEPENDENT_AMBULATORY_CARE_PROVIDER_SITE_OTHER): Payer: Medicare HMO | Admitting: Pharmacist

## 2021-08-14 DIAGNOSIS — R7301 Impaired fasting glucose: Secondary | ICD-10-CM

## 2021-08-14 DIAGNOSIS — I10 Essential (primary) hypertension: Secondary | ICD-10-CM

## 2021-08-14 DIAGNOSIS — E782 Mixed hyperlipidemia: Secondary | ICD-10-CM

## 2021-08-14 NOTE — Progress Notes (Signed)
Chronic Care Management Pharmacy Note  08/14/2021 Name:  Christopher Gregory MRN:  [id number] DOB:  03/21/1956  Summary: addressed HTN, HLD, and impaired fasting glucose. Discussed fish oil for elevated triglycerides but patient states has not worked for him in the past. Discussed lifestyle modifications and diet choices for other opportunities to lower TG.  Recommendations/Changes made from today's visit: none  Plan: f/u with pharmacist in 6 months  Subjective: Christopher Gregory is an 65 y.o. year old male who is a primary patient of Metheney, Rene Kocher, MD.  The CCM team was consulted for assistance with disease management and care coordination needs.    Engaged with patient by telephone for initial visit in response to provider referral for pharmacy case management and/or care coordination services.   Consent to Services:  The patient was given information about Chronic Care Management services, agreed to services, and gave verbal consent prior to initiation of services.  Please see initial visit note for detailed documentation.   Patient Care Team: Hali Marry, MD as PCP - General (Family Medicine) Dr. Rolley Sims (Ophthalmology) Adegoroye, Wynona Luna, MD as Referring Physician (Specialist) Darius Bump, [facility] as Pharmacist (Pharmacist)  Recent office visits:  05/13/21 Beatrice Lecher MD PCP- pt was seen for his annual wellness exam. Labs were ordered and provider discontinued Benzonatate, Guaifenesin Codeine, and Netarsudil Latanoprost. Follow up in 6 months.   Recent consult visits:  [facility] visits:  None in previous 6 months  Objective:  Lab Results  Component Value Date   CREATININE 1.63 (H) 05/13/2021   CREATININE 1.84 (H) 11/28/2020   CREATININE 1.88 (H) 11/12/2020    Lab Results  Component Value Date   HGBA1C 5.5 05/13/2021   Last diabetic Eye exam:  Lab Results  Component Value Date/Time   HMDIABEYEEXA No Retinopathy 03/27/2021 12:00 AM    Last  diabetic Foot exam: No results found for: HMDIABFOOTEX      Component Value Date/Time   CHOL 165 05/13/2021 0000   TRIG 277 (H) 05/13/2021 0000   HDL 35 (L) 05/13/2021 0000   CHOLHDL 4.7 05/13/2021 0000   VLDL 28 11/25/2015 0925   LDLCALC 90 05/13/2021 0000    Hepatic Function Latest Ref Rng & Units 05/13/2021 02/29/2020 03/14/2019  Total Protein 6.1 - 8.1 g/dL 6.7 6.6 6.0(L)  Albumin 3.6 - 5.1 g/dL - - -  AST 10 - 35 U/L _0 ALT 9 - 46 U/L _1 Alk Phosphatase 40 - 115 U/L - - -  Total Bilirubin 0.2 - 1.2 mg/dL 0.9 1.3(H) 0.9    Lab Results  Component Value Date/Time   TSH 1.07 05/13/2021 12:00 AM   TSH 1.15 11/12/2020 03:05 PM    CBC Latest Ref Rng & Units 05/13/2021 11/12/2020 03/15/2020  WBC 3.8 - 10.8 Thousand/uL 7.6 10.5 8.0  Hemoglobin 13.2 - 17.1 g/dL 16.9 16.2 17.0  Hematocrit 38.5 - 50.0 % 51.3(H) 47.6 52.1(H)  Platelets 140 - 400 Thousand/uL 256 283 278    Lab Results  Component Value Date/Time   VD25OH 127 (H) 05/13/2021 12:00 AM   VD25OH 131 (H) 11/12/2020 03:05 PM    Clinical ASCVD: No  The 10-year ASCVD risk score (Arnett DK, et al., 2019) is: 20.3%   Values used to calculate the score:     Age: 65 years     Sex: Male     Is Non-Hispanic African American: No     Diabetic: No  Tobacco smoker: No     Systolic Blood Pressure: 174 mmHg     Is BP treated: Yes     HDL Cholesterol: 35 mg/dL     Total Cholesterol: 165 mg/dL    Other: (CHADS2VASc if Afib, PHQ9 if depression, MMRC or CAT for COPD, ACT, DEXA)  Social History   Tobacco Use  Smoking Status Former   Types: Cigarettes   Quit date: 11/26/1973   Years since quitting: 47.7  Smokeless Tobacco Never   BP Readings from Last 3 Encounters:  05/13/21 137/66  11/11/20 132/73  08/05/20 (!) 145/90   Pulse Readings from Last 3 Encounters:  05/13/21 (!) 49  08/05/20 (!) 53  04/16/20 (!) 52   Wt Readings from Last 3 Encounters:  05/13/21 159 lb (72.1 kg)  11/11/20 165 lb (74.8 kg)   08/05/20 159 lb 8 oz (72.3 kg)    Assessment: Review of patient past medical history, allergies, medications, health status, including review of consultants reports, laboratory and other test data, was performed as part of comprehensive evaluation and provision of chronic care management services.   SDOH:  (Social Determinants of Health) assessments and interventions performed:    CCM Care Plan  Allergies  Allergen Reactions   Neomycin     Hives   Ace Inhibitors Other (See Comments)    Allergic reaction   Amlodipine     Swelling on $RemoveBef'10mg'LYOFQkQUjR$  dose   Prednisone Other (See Comments)    Urgent BP and inc blood glucose   Erythromycin Rash    Medications Reviewed Today     Reviewed by Hali Marry, MD (Physician) on 05/13/21 at [place] List Status: <None>   Medication Order Taking? Sig Documenting Provider Last Dose Status Informant  allopurinol (ZYLOPRIM) 100 MG tablet [id number] Yes TAKE 1 TABLET EVERY DAY Hali Marry, MD Taking Active   amLODipine (NORVASC) 5 MG tablet [id number] Yes TAKE 1 TABLET EVERY DAY Hali Marry, MD Taking Active   atenolol (TENORMIN) 100 MG tablet [id number] Yes TAKE 1 TABLET EVERY DAY Hali Marry, MD Taking Active   Azelastine HCl 0.15 % SOLN [id number] Yes INHALE 1 SPRAY INTO BOTH NOSTRILS TWICE A DAY Hali Marry, MD Taking Active   Black Pepper-Turmeric (TURMERIC COMPLEX/BLACK PEPPER PO) [id number] Yes Take 800 mg by mouth 2 (two) times daily. [provider] Taking Active   cetirizine (ZYRTEC) 10 MG tablet [id number] Yes Take 10 mg by mouth daily. [provider] Taking Active   Cholecalciferol (VITAMIN D PO) [id number] Yes Take 30,000 Int'l Units by mouth 2 (two) times a week. Monday,Thursday [provider] Taking Active Self  Coenzyme Q10-Red Yeast Rice (CO Q-10 PLUS RED YEAST RICE PO) [id number] Yes Take 2 capsules by mouth daily. [provider] Taking Active   colchicine 0.6  MG tablet [id number] Yes Take 1 tablet (0.6 mg total) by mouth daily as needed. Hali Marry, MD Taking Active   dorzolamide-timolol (COSOPT) 22.3-6.8 MG/ML ophthalmic solution [id number] Yes  [provider] Taking Active   furosemide (LASIX) 20 MG tablet [id number] Yes TAKE 1 TABLET EVERY DAY Hali Marry, MD Taking Active   latanoprost (XALATAN) 0.005 % ophthalmic solution [id number] Yes 1 drop at bedtime. [provider] Taking Active   [place] [id number] Yes at bedtime as needed. [provider] Taking Active   levothyroxine (SYNTHROID) 50 MCG tablet [id number] Yes TAKE 1 TABLET EVERY DAY Hali Marry, MD Taking Active   Menaquinone-7 (VITAMIN K2 PO)  [id number] Yes Take 300 mg by mouth daily. [provider] Taking Active Self  ondansetron (ZOFRAN ODT) 4 MG disintegrating tablet [id number] Yes Take 1 tablet (4 mg total) by mouth every 8 (eight) hours as needed for nausea or vomiting. Hali Marry, MD Taking Active   spironolactone (ALDACTONE) 25 MG tablet [id number] Yes TAKE 1 TABLET EVERY DAY Hali Marry, MD Taking Active   timolol (BETIMOL) 0.25 % ophthalmic solution [id number] Yes 1-2 drops 2 (two) times daily. 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   Microscopic polyangiitis ([place]) 02/10/2018   AKI (acute kidney injury) ([place]) 02/10/2018   Primary open-angle glaucoma 12/27/2017   Combined form of 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, left  eye 12/27/2017   Corneal edema of left eye 12/27/2017   Acquired deviated nasal septum 12/23/2016   Allergic rhinitis 01/03/2016   Primary open angle glaucoma (POAG) of both eyes 01/03/2016   Other long term (current) drug therapy 01/03/2016   HLD (hyperlipidemia) 01/03/2016   Elevated fasting blood sugar 01/03/2016   Calculus of kidney 01/03/2016   Left lumbar radiculopathy 04/26/2014   Essential hypertension, benign 05/26/2013   Kidney stone 05/26/2013   Avitaminosis D 11/28/2012    Immunization History  Administered Date(s) Administered   Tdap 11/09/2008, 11/30/2011   Zoster, Live 02/08/2016    Conditions to be addressed/monitored: HTN, HLD, and impaired fasting glucose  There are no care plans that you recently modified to display for this patient.   Medication Assistance: None required.  Patient affirms current coverage meets needs.  Patient's preferred pharmacy is:  [facility], [place] [address] Phone: [phone] Fax: [phone]  [id number] [id number]: [phone]: [phone] Uses pill box? Yes Pt endorses 100% compliance  Follow Up:  Patient agrees to Care Plan and Follow-up.  Plan: Telephone follow up appointment with care management team member scheduled for:  6 months  KDarius Bump

## 2021-08-14 NOTE — Patient Instructions (Signed)
Visit Information   PATIENT GOALS:   Goals Addressed             This Visit's Progress    Medication Management       Patient Goals/Self-Care Activities Over the next 180 days, patient will:  take medications as prescribed  Follow Up Plan: Telephone follow up appointment with care management team member scheduled for:  6 months         Consent to CCM Services: Christopher Gregory was given information about Chronic Care Management services including:  CCM service includes personalized support from designated clinical staff supervised by his physician, including individualized plan of care and coordination with other care providers 24/7 contact phone numbers for assistance for urgent and routine care needs. Service will only be billed when office clinical staff spend 20 minutes or more in a month to coordinate care. Only one practitioner may furnish and bill the service in a calendar month. The patient may stop CCM services at any time (effective at the end of the month) by phone call to the office staff. The patient will be responsible for cost sharing (co-pay) of up to 20% of the service fee (after annual deductible is met).  Patient agreed to services and verbal consent obtained.   Patient verbalizes understanding of instructions provided today and agrees to view in [name].   Telephone follow up appointment with care management team member scheduled for: 6 months  Larinda Buttery, PharmD Clinical Pharmacist [facility] [phone]    CLINICAL CARE PLAN: Patient Care Plan: Medication Management     Problem Identified: HTN, HLD, IFG      Long-Range Goal: Disease Progression Prevention   Start Date: 08/14/2021  This Visit's Progress: On track  Priority: High  Note:   Current Barriers:  None at present  Pharmacist Clinical Goal(s):  Over the next 180 days, patient will maintain control of chronic conditions as evidenced by medication  fill history, lab values,   through collaboration with PharmD and provider.   Interventions: 1:1 collaboration with Hali Marry, MD regarding development and update of comprehensive plan of care as evidenced by provider attestation and co-signature Inter-disciplinary care team collaboration (see longitudinal plan of care) Comprehensive medication review performed; medication list updated in electronic medical record  Impaired Fasting Glucose: - steroid induced  Controlled; current treatment:lifestyle modifications only at this time;   Current glucose readings: not currently checking, appropriate at this time  Denies hypoglycemic/hyperglycemic symptoms  Current meal patterns: to be discussed at future visits  Current exercise: 5 days per week  Recommended continue current regimen, A1c downtrend and doing well,  Hypertension:  Controlled; current treatment:amlodipine 86m daily, furosemide 258m spironolactone 2542maily;   Current home readings: not currently checking  Denies hypotensive/hypertensive symptoms  Recommended continue current regimen Hyperlipidemia:  Uncontrolled; current treatment:red yeast rice/co-Q10 daily;   Recommended continue current regimen, monitor triglycerides  Patient Goals/Self-Care Activities Over the next 180 days, patient will:  take medications as prescribed  Follow Up Plan: Telephone follow up appointment with care management team member scheduled for:  6 months

## 2021-09-08 DIAGNOSIS — E782 Mixed hyperlipidemia: Secondary | ICD-10-CM | POA: Diagnosis not present

## 2021-09-08 DIAGNOSIS — I10 Essential (primary) hypertension: Secondary | ICD-10-CM

## 2021-09-11 ENCOUNTER — Other Ambulatory Visit: Payer: Self-pay | Admitting: Family Medicine

## 2021-09-15 DIAGNOSIS — Z471 Aftercare following joint replacement surgery: Secondary | ICD-10-CM | POA: Diagnosis not present

## 2021-09-15 DIAGNOSIS — M25551 Pain in right hip: Secondary | ICD-10-CM | POA: Diagnosis not present

## 2021-09-15 DIAGNOSIS — Z96641 Presence of right artificial hip joint: Secondary | ICD-10-CM | POA: Diagnosis not present

## 2021-09-15 DIAGNOSIS — Y999 Unspecified external cause status: Secondary | ICD-10-CM | POA: Diagnosis not present

## 2021-09-15 DIAGNOSIS — X58XXXA Exposure to other specified factors, initial encounter: Secondary | ICD-10-CM | POA: Diagnosis not present

## 2021-09-15 DIAGNOSIS — T84020A Dislocation of internal right hip prosthesis, initial encounter: Secondary | ICD-10-CM | POA: Diagnosis not present

## 2021-09-30 ENCOUNTER — Encounter: Payer: Self-pay | Admitting: Family Medicine

## 2021-09-30 DIAGNOSIS — H527 Unspecified disorder of refraction: Secondary | ICD-10-CM | POA: Diagnosis not present

## 2021-09-30 DIAGNOSIS — H401111 Primary open-angle glaucoma, right eye, mild stage: Secondary | ICD-10-CM | POA: Diagnosis not present

## 2021-09-30 DIAGNOSIS — H401123 Primary open-angle glaucoma, left eye, severe stage: Secondary | ICD-10-CM | POA: Diagnosis not present

## 2021-09-30 LAB — HM DIABETES EYE EXAM

## 2021-10-01 DIAGNOSIS — H52209 Unspecified astigmatism, unspecified eye: Secondary | ICD-10-CM | POA: Diagnosis not present

## 2021-10-01 DIAGNOSIS — T84020A Dislocation of internal right hip prosthesis, initial encounter: Secondary | ICD-10-CM | POA: Diagnosis not present

## 2021-10-01 DIAGNOSIS — H5213 Myopia, bilateral: Secondary | ICD-10-CM | POA: Diagnosis not present

## 2021-10-01 DIAGNOSIS — H524 Presbyopia: Secondary | ICD-10-CM | POA: Diagnosis not present

## 2021-11-12 ENCOUNTER — Encounter: Payer: Self-pay | Admitting: Family Medicine

## 2021-11-12 ENCOUNTER — Other Ambulatory Visit: Payer: Self-pay

## 2021-11-12 ENCOUNTER — Ambulatory Visit (INDEPENDENT_AMBULATORY_CARE_PROVIDER_SITE_OTHER): Payer: Medicare HMO | Admitting: Family Medicine

## 2021-11-12 VITALS — BP 128/66 | HR 50 | Temp 97.9°F | Resp 16 | Ht 65.0 in | Wt 161.0 lb

## 2021-11-12 DIAGNOSIS — Z87828 Personal history of other (healed) physical injury and trauma: Secondary | ICD-10-CM | POA: Diagnosis not present

## 2021-11-12 DIAGNOSIS — R7301 Impaired fasting glucose: Secondary | ICD-10-CM | POA: Diagnosis not present

## 2021-11-12 DIAGNOSIS — N1831 Chronic kidney disease, stage 3a: Secondary | ICD-10-CM | POA: Diagnosis not present

## 2021-11-12 DIAGNOSIS — M1120 Other chondrocalcinosis, unspecified site: Secondary | ICD-10-CM

## 2021-11-12 DIAGNOSIS — M11272 Other chondrocalcinosis, left ankle and foot: Secondary | ICD-10-CM | POA: Diagnosis not present

## 2021-11-12 DIAGNOSIS — I10 Essential (primary) hypertension: Secondary | ICD-10-CM

## 2021-11-12 DIAGNOSIS — N057 Unspecified nephritic syndrome with diffuse crescentic glomerulonephritis: Secondary | ICD-10-CM | POA: Diagnosis not present

## 2021-11-12 DIAGNOSIS — E782 Mixed hyperlipidemia: Secondary | ICD-10-CM

## 2021-11-12 DIAGNOSIS — E038 Other specified hypothyroidism: Secondary | ICD-10-CM

## 2021-11-12 DIAGNOSIS — E559 Vitamin D deficiency, unspecified: Secondary | ICD-10-CM

## 2021-11-12 MED ORDER — LEVOTHYROXINE SODIUM 50 MCG PO TABS: 50.0000 ug | ORAL_TABLET | Freq: Every day | ORAL | 1 refills | Status: DC

## 2021-11-12 MED ORDER — ALLOPURINOL 100 MG PO TABS: 100.0000 mg | ORAL_TABLET | Freq: Every day | ORAL | 1 refills | Status: DC

## 2021-11-12 MED ORDER — ATENOLOL 100 MG PO TABS: 100.0000 mg | ORAL_TABLET | Freq: Every day | ORAL | 1 refills | Status: DC

## 2021-11-12 MED ORDER — FUROSEMIDE 20 MG PO TABS: 20.0000 mg | ORAL_TABLET | Freq: Every day | ORAL | 1 refills | Status: DC

## 2021-11-12 MED ORDER — COLCHICINE 0.6 MG PO TABS: 0.6000 mg | ORAL_TABLET | Freq: Every day | ORAL | 1 refills | Status: DC | PRN

## 2021-11-12 MED ORDER — SPIRONOLACTONE 25 MG PO TABS: 25.0000 mg | ORAL_TABLET | Freq: Every day | ORAL | 1 refills | Status: DC

## 2021-11-12 MED ORDER — AMLODIPINE BESYLATE 5 MG PO TABS: 5.0000 mg | ORAL_TABLET | Freq: Every day | ORAL | 1 refills | Status: DC

## 2021-11-12 NOTE — Assessment & Plan Note (Signed)
Due to recheck protein levels in the urine.  He has noticed that the urine looks more bubbly.

## 2021-11-12 NOTE — Assessment & Plan Note (Signed)
Overall doing really well no recent flares or exacerbations.  Continue with allopurinol.  Check uric acid level.  Okay to use colchicine as needed.

## 2021-11-12 NOTE — Assessment & Plan Note (Signed)
Still taking vitamin D supplementation.

## 2021-11-12 NOTE — Assessment & Plan Note (Signed)
Due to recheck renal function. 

## 2021-11-12 NOTE — Progress Notes (Signed)
Established Patient Office Visit  Subjective:  Patient ID: Christopher Gregory, male    DOB: 12-28-1955  Age: 66 y.o. MRN: [id number]  CC:  Chief Complaint  Patient presents with   Hypertension    Follow up    Gout    Follow up in right foot. Patient states it has improved with injection    HPI Christopher Gregory presents for   Hypertension- Pt denies chest pain, SOB, dizziness, or heart palpitations.  Taking meds as directed w/o problems.  Denies medication side effects.    F/U Gout -has not had any flares in the last year he still taking his allopurinol and feels like his vitamin D supplement has been helping as well  He actually dislocated his right hip in November.  He is doing better.  He did follow-up with orthopedist.  Hypothyroidism - Taking medication regularly in the AM away from food and vitamins, etc. No recent change to skin, hair, or energy levels.  He has noticed that his urine is more foamy than usual over the last couple of months.  He knows that can be an indication of increased protein levels.  Past Medical History:  Diagnosis Date   Allergy    Gout    Hypertension    Thyroid disease     Past Surgical History:  Procedure Laterality Date   KNEE ARTHROSCOPY     TOTAL HIP ARTHROPLASTY  2000   TOTAL HIP ARTHROPLASTY  2004    Family History  Problem Relation Age of Onset   Hypertension Mother    Hyperlipidemia Father     Social History   Socioeconomic History   Marital status: Married    Spouse name: Katharine Look   Number of children: 2   Years of education: Not on file   Highest education level: Not on file  Occupational History   Occupation: ATM Tech    Comment: Raynelle [employer], [employer]: [employer]  Tobacco Use   Smoking status: Former    Types: Cigarettes    Quit date: 11/26/1973    Years since quitting: 47.9   Smokeless tobacco: Never  Vaping Use   Vaping Use: Never used  Substance and Sexual Activity   Alcohol use: No   Drug use: No   Sexual  activity: Yes    Partners: Female  Other Topics Concern   Not on file  Social History Narrative   Swimming for exercise.    Social Determinants of Health   Financial Resource Strain: Not on file  Food Insecurity: Not on file  Transportation Needs: Not on file  Physical Activity: Not on file  Stress: Not on file  Social Connections: Not on file  Intimate Partner Violence: Not on file    Outpatient Medications Prior to Visit  Medication Sig Dispense Refill   Black Pepper-Turmeric (TURMERIC COMPLEX/BLACK PEPPER PO) Take 800 mg by mouth 2 (two) times daily.     cetirizine (ZYRTEC) 10 MG tablet Take 10 mg by mouth daily.     Cholecalciferol (VITAMIN D PO) Take 30,000 Int'l Units by mouth 2 (two) times a week. Monday,Thursday     Coenzyme Q10-Red Yeast Rice (CO Q-10 PLUS RED YEAST RICE PO) Take 2 capsules by mouth daily.     diphenhydrAMINE (SIMPLY SLEEP) 25 MG tablet Take 25 mg by mouth at bedtime as needed for sleep.     dorzolamide-timolol (COSOPT) 22.3-6.8 MG/ML ophthalmic solution Place 1 drop into both eyes 2 (two) times daily.  Ginger 500 MG CAPS Take by mouth daily.     latanoprost (XALATAN) 0.005 % ophthalmic solution 1 drop at bedtime.     Lavender Oil OIL at bedtime as needed.     Menaquinone-7 (VITAMIN K2 PO) Take 300 mg by mouth daily.     allopurinol (ZYLOPRIM) 100 MG tablet TAKE 1 TABLET EVERY DAY 90 tablet 1   amLODipine (NORVASC) 5 MG tablet TAKE 1 TABLET EVERY DAY 90 tablet 1   atenolol (TENORMIN) 100 MG tablet TAKE 1 TABLET EVERY DAY 90 tablet 0   colchicine 0.6 MG tablet Take 1 tablet (0.6 mg total) by mouth daily as needed. 30 tablet 1   furosemide (LASIX) 20 MG tablet TAKE 1 TABLET EVERY DAY 90 tablet 1   levothyroxine (SYNTHROID) 50 MCG tablet TAKE 1 TABLET EVERY DAY 90 tablet 1   spironolactone (ALDACTONE) 25 MG tablet TAKE 1 TABLET EVERY DAY 90 tablet 1   Azelastine HCl 0.15 % SOLN INHALE 1 SPRAY INTO BOTH NOSTRILS TWICE A DAY (Patient not taking: Reported  on 08/14/2021) 90 mL 1   No facility-administered medications prior to visit.    Allergies  Allergen Reactions   Neomycin     Hives   Ace Inhibitors Other (See Comments)    Allergic reaction   Amlodipine     Swelling on 33m dose   Prednisone Other (See Comments)    Urgent BP and inc blood glucose   Erythromycin Rash    ROS Review of Systems    Objective:    Physical Exam Constitutional:      Appearance: Normal appearance. He is well-developed.  HENT:     Head: Normocephalic and atraumatic.  Cardiovascular:     Rate and Rhythm: Normal rate and regular rhythm.     Heart sounds: Normal heart sounds.  Pulmonary:     Effort: Pulmonary effort is normal.     Breath sounds: Normal breath sounds.  Skin:    General: Skin is warm and dry.  Neurological:     Mental Status: He is alert and oriented to person, place, and time. Mental status is at baseline.  Psychiatric:        Behavior: Behavior normal.    BP 128/66    Pulse (!) 50    Temp 97.9 F (36.6 C)    Resp 16    Ht 5' 5"  (1.651 m)    Wt 161 lb (73 kg)    SpO2 100%    BMI 26.79 kg/m  Wt Readings from Last 3 Encounters:  11/12/21 161 lb (73 kg)  05/13/21 159 lb (72.1 kg)  11/11/20 165 lb (74.8 kg)     There are no preventive care reminders to display for this patient.  There are no preventive care reminders to display for this patient.  Lab Results  Component Value Date   TSH 1.07 05/13/2021   Lab Results  Component Value Date   WBC 7.6 05/13/2021   HGB 16.9 05/13/2021   HCT 51.3 (H) 05/13/2021   MCV 89.1 05/13/2021   PLT 256 05/13/2021   Lab Results  Component Value Date   NA 138 05/13/2021   K 5.0 05/13/2021   CO2 22 05/13/2021   GLUCOSE 128 (H) 05/13/2021   BUN 34 (H) 05/13/2021   CREATININE 1.63 (H) 05/13/2021   BILITOT 0.9 05/13/2021   ALKPHOS 33 (L) 12/28/2016   AST 18 05/13/2021   ALT 15 05/13/2021   PROT 6.7 05/13/2021   ALBUMIN 4.1 12/28/2016  CALCIUM 9.9 05/13/2021   Lab Results   Component Value Date   CHOL 165 05/13/2021   Lab Results  Component Value Date   HDL 35 (L) 05/13/2021   Lab Results  Component Value Date   LDLCALC 90 05/13/2021   Lab Results  Component Value Date   TRIG 277 (H) 05/13/2021   Lab Results  Component Value Date   CHOLHDL 4.7 05/13/2021   Lab Results  Component Value Date   HGBA1C 5.5 05/13/2021      Assessment & Plan:   Problem List Items Addressed This Visit       Cardiovascular and Mediastinum   Essential hypertension, benign - Primary   Relevant Medications   spironolactone (ALDACTONE) 25 MG tablet   amLODipine (NORVASC) 5 MG tablet   atenolol (TENORMIN) 100 MG tablet   furosemide (LASIX) 20 MG tablet   Other Relevant Orders   Triglycerides   TSH   Uric acid   COMPLETE METABOLIC PANEL WITH GFR   Urine Microalbumin w/creat. ratio   Urinalysis, microscopic only   Hemoglobin A1c     Endocrine   IFG (impaired fasting glucose)   Relevant Orders   Triglycerides   TSH   Uric acid   COMPLETE METABOLIC PANEL WITH GFR   Urine Microalbumin w/creat. ratio   Urinalysis, microscopic only   Hemoglobin A1c   Hypothyroidism   Relevant Medications   levothyroxine (SYNTHROID) 50 MCG tablet   atenolol (TENORMIN) 100 MG tablet   Other Relevant Orders   TSH     Musculoskeletal and Integument   Pseudogout of left ankle    Overall doing really well no recent flares or exacerbations.  Continue with allopurinol.  Check uric acid level.  Okay to use colchicine as needed.      Relevant Medications   colchicine 0.6 MG tablet   allopurinol (ZYLOPRIM) 100 MG tablet     Genitourinary   Stage 3a chronic kidney disease (HCC)    Due to recheck renal function.      Relevant Orders   COMPLETE METABOLIC PANEL WITH GFR   [place] glomerulonephritis    Due to recheck protein levels in the urine.  He has noticed that the urine looks more bubbly.        Other   HLD (hyperlipidemia)     triglycerides were elevated at  last set of labs we will recheck again today.      Relevant Medications   spironolactone (ALDACTONE) 25 MG tablet   amLODipine (NORVASC) 5 MG tablet   atenolol (TENORMIN) 100 MG tablet   furosemide (LASIX) 20 MG tablet   Other Relevant Orders   Triglycerides   History of dislocation of hip, right    He is doing much better and is back to playing racquetball.      Avitaminosis D    Still taking vitamin D supplementation.      Other Visit Diagnoses     Pseudogout       Relevant Medications   colchicine 0.6 MG tablet   allopurinol (ZYLOPRIM) 100 MG tablet   Other Relevant Orders   Triglycerides   TSH   Uric acid   COMPLETE METABOLIC PANEL WITH GFR   Urine Microalbumin w/creat. ratio   Urinalysis, microscopic only   Hemoglobin A1c       Meds ordered this encounter  Medications   colchicine 0.6 MG tablet    Sig: Take 1 tablet (0.6 mg total) by mouth daily as needed.    Dispense:  30 tablet    Refill:  1   levothyroxine (SYNTHROID) 50 MCG tablet    Sig: Take 1 tablet (50 mcg total) by mouth daily.    Dispense:  90 tablet    Refill:  1   spironolactone (ALDACTONE) 25 MG tablet    Sig: Take 1 tablet (25 mg total) by mouth daily.    Dispense:  90 tablet    Refill:  1   allopurinol (ZYLOPRIM) 100 MG tablet    Sig: Take 1 tablet (100 mg total) by mouth daily.    Dispense:  90 tablet    Refill:  1   amLODipine (NORVASC) 5 MG tablet    Sig: Take 1 tablet (5 mg total) by mouth daily.    Dispense:  90 tablet    Refill:  1   atenolol (TENORMIN) 100 MG tablet    Sig: Take 1 tablet (100 mg total) by mouth daily.    Dispense:  90 tablet    Refill:  1   furosemide (LASIX) 20 MG tablet    Sig: Take 1 tablet (20 mg total) by mouth daily.    Dispense:  90 tablet    Refill:  1    Follow-up: Return in about 6 months (around 05/12/2022) for Hypertension.    Beatrice Lecher, MD

## 2021-11-12 NOTE — Assessment & Plan Note (Signed)
triglycerides were elevated at last set of labs we will recheck again today.

## 2021-11-12 NOTE — Assessment & Plan Note (Signed)
He is doing much better and is back to playing racquetball.

## 2021-11-13 LAB — URINALYSIS, MICROSCOPIC ONLY
Bacteria, UA: NONE SEEN /HPF
Crystals: NONE SEEN /HPF
Hyaline Cast: NONE SEEN /LPF
Squamous Epithelial / HPF: NONE SEEN /HPF (ref <=5)

## 2021-11-13 LAB — HEMOGLOBIN A1C
Hgb A1c MFr Bld: 5.7 % of total Hgb — ABNORMAL HIGH (ref <5.7)
Mean Plasma Glucose: 117 mg/dL
eAG (mmol/L): 6.5 mmol/L

## 2021-11-13 LAB — MICROALBUMIN / CREATININE URINE RATIO
Creatinine, Urine: 23 mg/dL (ref 20–320)
Microalb Creat Ratio: 1652 mcg/mg creat — ABNORMAL HIGH (ref <30)
Microalb, Ur: 38 mg/dL

## 2021-11-13 LAB — COMPLETE METABOLIC PANEL WITH GFR
AG Ratio: 1.5 (calc) (ref 1.0–2.5)
ALT: 18 U/L (ref 9–46)
AST: 19 U/L (ref 10–35)
Albumin: 4.3 g/dL (ref 3.6–5.1)
Alkaline phosphatase (APISO): 47 U/L (ref 35–144)
BUN/Creatinine Ratio: 21 (calc) (ref 6–22)
BUN: 32 mg/dL — ABNORMAL HIGH (ref 7–25)
CO2: 27 mmol/L (ref 20–32)
Calcium: 10.1 mg/dL (ref 8.6–10.3)
Chloride: 104 mmol/L (ref 98–110)
Creat: 1.51 mg/dL — ABNORMAL HIGH (ref 0.70–1.35)
Globulin: 2.9 g/dL (calc) (ref 1.9–3.7)
Glucose, Bld: 127 mg/dL — ABNORMAL HIGH (ref 65–99)
Potassium: 5.8 mmol/L — ABNORMAL HIGH (ref 3.5–5.3)
Sodium: 138 mmol/L (ref 135–146)
Total Bilirubin: 1.3 mg/dL — ABNORMAL HIGH (ref 0.2–1.2)
Total Protein: 7.2 g/dL (ref 6.1–8.1)
eGFR: 50 mL/min/{1.73_m2} — ABNORMAL LOW (ref >=60)

## 2021-11-13 LAB — TSH: TSH: 2.64 mIU/L (ref 0.40–4.50)

## 2021-11-13 LAB — TRIGLYCERIDES: Triglycerides: 167 mg/dL — ABNORMAL HIGH (ref <150)

## 2021-11-13 LAB — URIC ACID: Uric Acid, Serum: 6.7 mg/dL (ref 4.0–8.0)

## 2021-11-13 NOTE — Progress Notes (Signed)
Hi Christopher Gregory, triglycerides do look a lot better.  Down to 167 which is great.  So great work just continue to work on [employer] and regular exercise.  Thyroid also looks good at 2.6.  Acid level is okay.  Optimally under 6 but it is 6.7 and since you are not having any flares that I think that that is fine.  Kidney function is stable around 1.5.  Liver enzymes are normal.  Your potassium was high.  I am not sure why its been normal in the past.  If your potassium is too high it can cause heart arrhythmias so I definitely want a recheck your potassium on Friday if you can come back by.  Just make sure its not a lab error.  Your protein levels are way up again similar to about 2 years ago and they had come back down last January.  No bacteria in the urine.  Your A1c was 5.7.  Still in the prediabetes range.  Up a little bit from last time.

## 2021-11-19 ENCOUNTER — Other Ambulatory Visit: Payer: Medicare HMO

## 2021-11-21 ENCOUNTER — Other Ambulatory Visit: Payer: Self-pay

## 2021-11-21 DIAGNOSIS — E875 Hyperkalemia: Secondary | ICD-10-CM | POA: Diagnosis not present

## 2021-11-21 NOTE — Progress Notes (Signed)
Ordered labs

## 2021-11-22 LAB — COMPLETE METABOLIC PANEL WITH GFR
AG Ratio: 1.6 (calc) (ref 1.0–2.5)
ALT: 16 U/L (ref 9–46)
AST: 27 U/L (ref 10–35)
Albumin: 3.8 g/dL (ref 3.6–5.1)
Alkaline phosphatase (APISO): 39 U/L (ref 35–144)
BUN/Creatinine Ratio: 20 (calc) (ref 6–22)
BUN: 33 mg/dL — ABNORMAL HIGH (ref 7–25)
CO2: 25 mmol/L (ref 20–32)
Calcium: 9.5 mg/dL (ref 8.6–10.3)
Chloride: 108 mmol/L (ref 98–110)
Creat: 1.65 mg/dL — ABNORMAL HIGH (ref 0.70–1.35)
Globulin: 2.4 g/dL (calc) (ref 1.9–3.7)
Glucose, Bld: 127 mg/dL — ABNORMAL HIGH (ref 65–99)
Potassium: 4.2 mmol/L (ref 3.5–5.3)
Sodium: 141 mmol/L (ref 135–146)
Total Bilirubin: 1.8 mg/dL — ABNORMAL HIGH (ref 0.2–1.2)
Total Protein: 6.2 g/dL (ref 6.1–8.1)
eGFR: 45 mL/min/{1.73_m2} — ABNORMAL LOW (ref >=60)

## 2021-11-23 ENCOUNTER — Other Ambulatory Visit: Payer: Self-pay | Admitting: Family Medicine

## 2021-11-23 DIAGNOSIS — I10 Essential (primary) hypertension: Secondary | ICD-10-CM

## 2021-11-23 DIAGNOSIS — M11272 Other chondrocalcinosis, left ankle and foot: Secondary | ICD-10-CM

## 2021-11-23 DIAGNOSIS — E038 Other specified hypothyroidism: Secondary | ICD-10-CM

## 2021-11-25 NOTE — Progress Notes (Signed)
Hi Christopher Gregory, kidney function is right around 1.6 which is pretty stable for you.  You tend to jump around between about 1.5 and 1.8.  Bilirubin was up just a little bit at 1.8.

## 2021-11-26 ENCOUNTER — Ambulatory Visit (INDEPENDENT_AMBULATORY_CARE_PROVIDER_SITE_OTHER): Payer: Medicare HMO | Admitting: Family Medicine

## 2021-11-26 DIAGNOSIS — Z0000 Encounter for general adult medical examination without abnormal findings: Secondary | ICD-10-CM | POA: Diagnosis not present

## 2021-11-26 NOTE — Patient Instructions (Addendum)
[facility] Maintenance Summary and Written Plan of Care  [name] ,  Thank you for allowing me to perform your Medicare Annual Wellness Visit and for your ongoing commitment to your health.   Health Maintenance & Immunization History Health Maintenance  Topic Date Due   COVID-19 Vaccine (1) 11/28/2021 (Originally 12/11/1956)   Zoster Vaccines- Shingrix (1 of 2) 02/10/2022 (Originally 06/10/1973)   Pneumonia Vaccine 54+ Years old (1 - PCV) 11/12/2022 (Originally [birth year]10/1962)   INFLUENZA VACCINE  02/07/2024 (Originally 06/09/2021)   TETANUS/TDAP  11/29/2021   COLON CANCER SCREENING ANNUAL FOBT  05/23/2022   Hepatitis C Screening  Completed   HPV VACCINES  Aged Out   Fecal DNA (Cologuard)  Discontinued   Immunization History  Administered Date(s) Administered   Tdap 11/09/2008, 11/30/2011   Zoster, Live 02/08/2016    These are the patient goals that we discussed:  Goals Addressed              This Visit's Progress     Patient Stated (pt-stated)        Continue to be able to healthy and active.        This is a list of Health Maintenance Items that are overdue or due now: Pneumococcal vaccine  Influenza vaccine Td vaccine Shingrix  Colon screening (Annual FOBT)- due in July, 2023.  Patient declined all of the vaccines at this time.  Orders/Referrals Placed Today: No orders of the defined types were placed in this encounter.  (Contact our referral department at [phone] if you have not spoken with someone about your referral appointment within the next 5 days)    Follow-up Plan  Follow-up with Hali Marry, MD as planned Medicare wellness visit in one year. Patient will access AVS on my chart. Health Maintenance, Male Adopting a healthy lifestyle and getting preventive care are important in promoting health and wellness. Ask your health care provider about: The right schedule for you to have regular tests and  exams. Things you can do on your own to prevent diseases and keep yourself healthy. What should I know about diet, weight, and exercise? Eat a healthy diet  Eat a diet that includes plenty of vegetables, fruits, low-fat dairy products, and lean protein. Do not eat a lot of foods that are high in solid fats, added sugars, or sodium. Maintain a healthy weight Body mass index (BMI) is a measurement that can be used to identify possible weight problems. It estimates body fat based on height and weight. Your health care provider can help determine your BMI and help you achieve or maintain a healthy weight. Get regular exercise Get regular exercise. This is one of the most important things you can do for your health. Most adults should: Exercise for at least 150 minutes each week. The exercise should increase your heart rate and make you sweat (moderate-intensity exercise). Do strengthening exercises at least twice a week. This is in addition to the moderate-intensity exercise. Spend less time sitting. Even light physical activity can be beneficial. Watch cholesterol and blood lipids Have your blood tested for lipids and cholesterol at 68 years of age, then have this test every 5 years. You may need to have your cholesterol levels checked more often if: Your lipid or cholesterol levels are high. You are older than 68 years of age. You are at high risk for heart disease. What should I know about cancer screening? Many types of cancers can be detected early and may often  be prevented. Depending on your health history and family history, you may need to have cancer screening at various ages. This may include screening for: Colorectal cancer. Prostate cancer. Skin cancer. Lung cancer. What should I know about heart disease, diabetes, and high blood pressure? Blood pressure and heart disease High blood pressure causes heart disease and increases the risk of stroke. This is more likely to develop in  people who have high blood pressure readings or are overweight. Talk with your health care provider about your target blood pressure readings. Have your blood pressure checked: Every 3-5 years if you are 63-75 years of age. Every year if you are 83 years old or older. If you are between the ages of 65 and 34 and are a current or former smoker, ask your health care provider if you should have a one-time screening for abdominal aortic aneurysm (AAA). Diabetes Have regular diabetes screenings. This checks your fasting blood sugar level. Have the screening done: Once every three years after age 7 if you are at a normal weight and have a low risk for diabetes. More often and at a younger age if you are overweight or have a high risk for diabetes. What should I know about preventing infection? Hepatitis B If you have a higher risk for hepatitis B, you should be screened for this virus. Talk with your health care provider to find out if you are at risk for hepatitis B infection. Hepatitis C Blood testing is recommended for: Everyone born from [birth year] through 1967. Anyone with known risk factors for hepatitis C. Sexually transmitted infections (STIs) You should be screened each year for STIs, including gonorrhea and chlamydia, if: You are sexually active and are younger than 68 years of age. You are older than 68 years of age and your health care provider tells you that you are at risk for this type of infection. Your sexual activity has changed since you were last screened, and you are at increased risk for chlamydia or gonorrhea. Ask your health care provider if you are at risk. Ask your health care provider about whether you are at high risk for HIV. Your health care provider may recommend a prescription medicine to help prevent HIV infection. If you choose to take medicine to prevent HIV, you should first get tested for HIV. You should then be tested every 3 months for as long as you are taking the  medicine. Follow these instructions at home: Alcohol use Do not drink alcohol if your health care provider tells you not to drink. If you drink alcohol: Limit how much you have to 0-2 drinks a day. Know how much alcohol is in your drink. In the [place] one drink equals one 12 oz bottle of beer (355 mL), one 5 oz glass of wine (148 mL), or one 1 oz glass of hard liquor (44 mL). Lifestyle Do not use any products that contain nicotine or tobacco. These products include cigarettes, chewing tobacco, and vaping devices, such as e-cigarettes. If you need help quitting, ask your health care provider. Do not use street drugs. Do not share needles. Ask your health care provider for help if you need support or information about quitting drugs. General instructions Schedule regular health, dental, and eye exams. Stay current with your vaccines. Tell your health care provider if: You often feel depressed. You have ever been abused or do not feel safe at home. Summary Adopting a healthy lifestyle and getting preventive care are important in promoting health and  wellness. Follow your health care provider's instructions about healthy diet, exercising, and getting tested or screened for diseases. Follow your health care provider's instructions on monitoring your cholesterol and blood pressure. This information is not intended to replace advice given to you by your health care provider. Make sure you discuss any questions you have with your health care provider. Document Revised: 03/17/2021 Document Reviewed: 03/17/2021 Elsevier Patient Education  [place].

## 2021-11-26 NOTE — Progress Notes (Signed)
MEDICARE ANNUAL WELLNESS VISIT  11/26/2021  Telephone Visit Disclaimer This Medicare AWV was conducted by telephone due to national recommendations for restrictions regarding the COVID-19 Pandemic (e.g. social distancing).  I verified, using two identifiers, that I am speaking with Christopher Gregory or their authorized healthcare agent. I discussed the limitations, risks, security, and privacy concerns of performing an evaluation and management service by telephone and the potential availability of an in-person appointment in the future. The patient expressed understanding and agreed to proceed.  Location of Patient: Home Location of Provider (nurse):  In the office.  Subjective:    Christopher Gregory is a 66 y.o. male patient of Metheney, Christopher Kocher, MD who had a Medicare Annual Wellness Visit today via telephone. Christopher Gregory is Retired and lives with their family. he has 1 child. he reports that he is socially active and does interact with friends/family regularly. he is moderately physically active and enjoys playing racquetball and going to the gym four times a week..  Patient Care Team: Christopher Marry, MD as PCP - General (Family Medicine) Dr. Rolley Gregory (Ophthalmology) Adegoroye, Wynona Luna, MD as Referring Physician (Specialist) Christopher Gregory, [facility] as Pharmacist (Pharmacist)  Advanced Directives 05/13/2021 08/05/2020 11/26/2014 05/01/2014  Does Patient Have a Medical Advance Directive? Yes No No Patient does not have advance directive;Patient would like information  Type of Advance Directive Living will;Healthcare Power of Attorney - - -  [place] in Chart? No - copy requested - - -  Would patient like information on creating a medical advance directive? - Yes (MAU/Ambulatory/Procedural Areas - Information given) Yes - Educational materials given [facility] Utilization Over the Past 12 Months: # of hospitalizations or ER visits: 1 # of surgeries: 0  Review  of Systems    Patient reports that his overall health is better compared to last year.  History obtained from chart review and the patient  Patient Reported Readings (BP, Pulse, CBG, Weight, etc) none  Pain Assessment       Current Medications & Allergies (verified) Allergies as of 11/26/2021       Reactions   Neomycin    Hives   Ace Inhibitors Other (See Comments)   Allergic reaction   Amlodipine    Swelling on 12m dose   Prednisone Other (See Comments)   Urgent BP and inc blood glucose   Erythromycin Rash        Medication List        Accurate as of November 26, 2021 11:05 AM. If you have any questions, ask your nurse or doctor.          allopurinol 100 MG tablet Commonly known as: ZYLOPRIM Take 1 tablet (100 mg total) by mouth daily.   amLODipine 5 MG tablet Commonly known as: NORVASC Take 1 tablet (5 mg total) by mouth daily.   atenolol 100 MG tablet Commonly known as: TENORMIN Take 1 tablet (100 mg total) by mouth daily.   Azelastine HCl 0.15 % Soln INHALE 1 SPRAY INTO BOTH NOSTRILS TWICE A DAY   cetirizine 10 MG tablet Commonly known as: ZYRTEC Take 10 mg by mouth daily.   CO Q-10 PLUS RED YEAST RICE PO Take 2 capsules by mouth daily.   colchicine 0.6 MG tablet Take 1 tablet (0.6 mg total) by mouth daily as needed.   dorzolamide-timolol 22.3-6.8 MG/ML ophthalmic solution Commonly known as: COSOPT Place 1 drop into both eyes 2 (two) times daily.  furosemide 20 MG tablet Commonly known as: LASIX Take 1 tablet (20 mg total) by mouth daily.   Ginger 500 MG Caps Take by mouth daily.   latanoprost 0.005 % ophthalmic solution Commonly known as: XALATAN 1 drop at bedtime.   Lavender Oil Oil at bedtime as needed.   levothyroxine 50 MCG tablet Commonly known as: SYNTHROID Take 1 tablet (50 mcg total) by mouth daily.   Simply Sleep 25 MG tablet Generic drug: diphenhydrAMINE Take 25 mg by mouth at bedtime as needed for sleep.    spironolactone 25 MG tablet Commonly known as: ALDACTONE Take 1 tablet (25 mg total) by mouth daily.   TURMERIC COMPLEX/BLACK PEPPER PO Take 800 mg by mouth 2 (two) times daily.   VITAMIN D PO Take 30,000 Int'l Units by mouth 2 (two) times a week. Monday,Thursday   VITAMIN K2 PO Take 300 mg by mouth daily.        History (reviewed): Past Medical History:  Diagnosis Date   Allergy    Gout    Hypertension    Thyroid disease    Past Surgical History:  Procedure Laterality Date   KNEE ARTHROSCOPY     TOTAL HIP ARTHROPLASTY  2000   TOTAL HIP ARTHROPLASTY  2004   Family History  Problem Relation Age of Onset   Hypertension Mother    Hyperlipidemia Father    Social History   Socioeconomic History   Marital status: Married    Spouse name: Christopher Gregory   Number of children: 2   Years of education: Not on file   Highest education level: Not on file  Occupational History   Occupation: ATM Tech    Comment: Raynelle [employer], [place] Education administrator: [employer]  Tobacco Use   Smoking status: Former    Types: Cigarettes    Quit date: 11/26/1973    Years since quitting: 48.0   Smokeless tobacco: Never  Vaping Use   Vaping Use: Never used  Substance and Sexual Activity   Alcohol use: No   Drug use: No   Sexual activity: Yes    Partners: Female  Other Topics Concern   Not on file  Social History Narrative   Swimming for exercise.    Social Determinants of Health   Financial Resource Strain: Not on file  Food Insecurity: Not on file  Transportation Needs: Not on file  Physical Activity: Not on file  Stress: Not on file  Social Connections: Not on file    Activities of Daily Living No flowsheet data found.  Patient Education/ Literacy    Exercise    Diet Patient reports consuming 2 meals a day and 2 snack(s) a week Patient reports that his primary diet is: Regular Patient reports that she does have regular access to food.   Depression Screen PHQ 2/9 Scores  11/12/2021 05/13/2021 08/05/2020 04/16/2020 07/11/2019 08/02/2017 11/25/2015  PHQ - 2 Score 0 0 0 0 0 0 0  PHQ- 9 Score - - - 0 - - -     Fall Risk Fall Risk  11/12/2021 05/13/2021 08/05/2020 04/16/2020 07/11/2019  Falls in the past year? 0 0 0 0 0  Number falls in past yr: 0 0 0 - 0  Injury with Fall? 0 0 0 - 0  Risk for fall due to : No Fall Risks No Fall Risks - - -  Follow up Falls prevention discussed;Falls evaluation completed Follow up appointment;Falls evaluation completed Falls evaluation completed - -     Objective:  El[place]  Wirt seemed alert and oriented and he participated appropriately during our telephone visit.  Blood Pressure Weight BMI  BP Readings from Last 3 Encounters:  11/12/21 128/66  05/13/21 137/66  11/11/20 132/73   Wt Readings from Last 3 Encounters:  11/12/21 161 lb (73 kg)  05/13/21 159 lb (72.1 kg)  11/11/20 165 lb (74.8 kg)   BMI Readings from Last 1 Encounters:  11/12/21 26.79 kg/m    *Unable to obtain current vital signs, weight, and BMI due to telephone visit type  Hearing/Vision  Clarnce did not seem to have difficulty with hearing/understanding during the telephone conversation Reports that he has had a formal eye exam by an eye care professional within the past year Reports that he has not had a formal hearing evaluation within the past year *Unable to fully assess hearing and vision during telephone visit type  Cognitive Function: 6CIT Screen 08/05/2020 07/11/2019  What Year? 0 points 0 points  What month? 0 points 0 points  What time? 0 points 0 points  Count back from 20 0 points 0 points  Months in reverse 0 points 0 points  Repeat phrase 0 points 0 points  Total Score 0 0   (Normal:0-7, Significant for Dysfunction: >8)  Normal Cognitive Function Screening: Yes   Immunization & Health Maintenance Record Immunization History  Administered Date(s) Administered   Tdap 11/09/2008, 11/30/2011   Zoster, Live 02/08/2016    Health Maintenance  Topic  Date Due   COVID-19 Vaccine (1) 11/28/2021 (Originally 12/11/1956)   Zoster Vaccines- Shingrix (1 of 2) 02/10/2022 (Originally 06/10/1973)   Pneumonia Vaccine 36+ Years old (1 - PCV) 11/12/2022 (Originally [birth year]10/1960)   INFLUENZA VACCINE  02/07/2024 (Originally 06/09/2021)   TETANUS/TDAP  11/29/2021   COLON CANCER SCREENING ANNUAL FOBT  05/23/2022   Hepatitis C Screening  Completed   HPV VACCINES  Aged Out   Fecal DNA (Cologuard)  Discontinued       Assessment  This is a routine wellness examination for [employer].  Health Maintenance: Due or Overdue There are no preventive care reminders to display for this patient.  Burton Gahan does not need a referral for Community Assistance: Care Management:   no Social Work:    no Prescription Assistance:  no Nutrition/Diabetes Education:  no   Plan:  Personalized Goals  Goals Addressed   None    Personalized Health Maintenance & Screening Recommendations  Pneumococcal vaccine  Influenza vaccine Td vaccine Shingrix  Colon screening (Annual FOBT)- due in July, 2023.  Patient declined all of the vaccines at this time.  Lung Cancer Screening Recommended: no (Low Dose CT Chest recommended if Age 68-80 years, 30 pack-year currently smoking OR have quit w/in past 15 years) Hepatitis C Screening recommended: no HIV Screening recommended: no  Advanced Directives: Written information was not prepared per patient's request.  Referrals & Orders No orders of the defined types were placed in this encounter.   Follow-up Plan Follow-up with Christopher Marry, MD as planned Medicare wellness visit in one year. Patient will access AVS on my chart.   I have personally reviewed and noted the following in the patients chart:   Medical and social history Use of alcohol, tobacco or illicit drugs  Current medications and supplements Functional ability and status Nutritional status Physical activity Advanced directives List of other  physicians Hospitalizations, surgeries, and ER visits in previous 12 months Vitals Screenings to include cognitive, depression, and falls Referrals and appointments  In addition, I have reviewed and discussed  with Christopher Gregory certain preventive protocols, quality metrics, and best practice recommendations. A written personalized care plan for preventive services as well as general preventive health recommendations is available and can be mailed to the patient at his request.      Tinnie Gens, RN  11/26/2021

## 2021-12-15 ENCOUNTER — Ambulatory Visit (INDEPENDENT_AMBULATORY_CARE_PROVIDER_SITE_OTHER): Payer: Medicare HMO

## 2021-12-15 ENCOUNTER — Other Ambulatory Visit: Payer: Self-pay

## 2021-12-15 ENCOUNTER — Ambulatory Visit (INDEPENDENT_AMBULATORY_CARE_PROVIDER_SITE_OTHER): Payer: Medicare HMO | Admitting: Sports Medicine

## 2021-12-15 DIAGNOSIS — Z8739 Personal history of other diseases of the musculoskeletal system and connective tissue: Secondary | ICD-10-CM | POA: Diagnosis not present

## 2021-12-15 DIAGNOSIS — I82441 Acute embolism and thrombosis of right tibial vein: Secondary | ICD-10-CM | POA: Diagnosis not present

## 2021-12-15 DIAGNOSIS — M7989 Other specified soft tissue disorders: Secondary | ICD-10-CM

## 2021-12-15 DIAGNOSIS — I82401 Acute embolism and thrombosis of unspecified deep veins of right lower extremity: Secondary | ICD-10-CM | POA: Insufficient documentation

## 2021-12-15 DIAGNOSIS — I824Y1 Acute embolism and thrombosis of unspecified deep veins of right proximal lower extremity: Secondary | ICD-10-CM

## 2021-12-15 DIAGNOSIS — M79661 Pain in right lower leg: Secondary | ICD-10-CM | POA: Diagnosis not present

## 2021-12-15 DIAGNOSIS — I82411 Acute embolism and thrombosis of right femoral vein: Secondary | ICD-10-CM | POA: Diagnosis not present

## 2021-12-15 MED ORDER — APIXABAN 5 MG PO TABS: ORAL_TABLET | ORAL | 5 refills | Status: DC

## 2021-12-15 MED ORDER — TRAMADOL HCL 50 MG PO TABS: 50.0000 mg | ORAL_TABLET | Freq: Three times a day (TID) | ORAL | 0 refills | Status: DC | PRN

## 2021-12-15 NOTE — Addendum Note (Signed)
Addended by: Monica Becton on: 12/15/2021 04:24 PM   Modules accepted: Orders

## 2021-12-15 NOTE — Assessment & Plan Note (Addendum)
This is a pleasant 66 year old male, approximately 2 to 3 months ago he dislocated his hip arthroplasty, he was in a brace for 6 weeks. Then over the past 3 weeks or so he has had increasing swelling right calf with pain localized at the gastrocnemius musculotendinous junction. On exam his right calf is swollen compared with the left, he has a positive Homans' sign. Good pulses, knee exam is unremarkable. Differential includes DVT and Baker's cyst. We will go ahead and get a DVT ultrasound and add tramadol for pain. I have advised him to also get some compression hose.  Update: Ultrasound shows an acute DVT tibial and femoral veins, though this is his second DVT, considering immobilization after hip arthroplasty dislocation I would consider this a provoked DVT which means about 6 months of Eliquis.  Avoid NSAIDs.  Creatinine greater than 1.5 but patient is less than 19 years old and weight greater than 60 kg so we will use normal dosing.  Further management per PCP.

## 2021-12-15 NOTE — Progress Notes (Addendum)
° ° °  Procedures performed today:    None.  Independent interpretation of notes and tests performed by another provider:   None.  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 Then over the past 3 weeks or so he has had increasing swelling right calf with pain localized at the gastrocnemius musculotendinous junction. On exam his right calf is swollen compared with the left, he has a positive Homans' sign. Good pulses, knee exam is unremarkable. Differential includes DVT and Baker's cyst. We will go ahead and get a DVT ultrasound and add tramadol for pain. I have advised him to also get some compression hose.  Update: Ultrasound shows an acute DVT tibial and femoral veins, though this is his second DVT, considering immobilization after hip arthroplasty dislocation I would consider this a provoked DVT which means about 6 months of Eliquis.  Avoid NSAIDs.  Creatinine greater than 1.5 but patient is less than 32 years old and weight greater than 60 kg so we will use normal dosing.  Further management per PCP.   ___________________________________________ Ihor Austin. Benjamin Stain, M.D., ABFM., CAQSM. Primary Care and Sports Medicine [facility]  Adjunct Instructor of Family Medicine  [employer]

## 2021-12-18 ENCOUNTER — Telehealth: Payer: Self-pay

## 2021-12-18 NOTE — Telephone Encounter (Signed)
Patient's wife called stating patient saw Dr. Benjamin Stain and was diagnosed with a DVT in his right calf. Patient's wife stated patient was prescribed Eliquis and was told to follow up with Dr. Linford Arnold in 1 week. Patient's wife stated patient is still complaining of pain in his right calf. I advised and offered patient an appointment today in our office for follow up. Patient's wife stated she is only able to bring patient in after 2:30 today. Dr. Norman Herrlich schedule is full today. I offered patient appointment with another provider in our office today. Patient's wife declined and stated she will schedule appointment for tomorrow to see Dr. Linford Arnold only. I advised patient's wife to have patient seek immediate care at the ED if patient symptoms worsens or if patient develops new symptoms before scheduled appointment tomorrow. Patient's wife agreed and verbalized understanding.

## 2021-12-19 ENCOUNTER — Encounter: Payer: Self-pay | Admitting: Family Medicine

## 2021-12-19 ENCOUNTER — Ambulatory Visit (INDEPENDENT_AMBULATORY_CARE_PROVIDER_SITE_OTHER): Payer: Medicare HMO | Admitting: Family Medicine

## 2021-12-19 ENCOUNTER — Other Ambulatory Visit: Payer: Self-pay

## 2021-12-19 VITALS — BP 155/92 | HR 69 | Resp 18 | Ht 65.0 in | Wt 162.0 lb

## 2021-12-19 DIAGNOSIS — R109 Unspecified abdominal pain: Secondary | ICD-10-CM

## 2021-12-19 DIAGNOSIS — I82411 Acute embolism and thrombosis of right femoral vein: Secondary | ICD-10-CM | POA: Diagnosis not present

## 2021-12-19 DIAGNOSIS — I10 Essential (primary) hypertension: Secondary | ICD-10-CM

## 2021-12-19 NOTE — Progress Notes (Signed)
Acute Office Visit  Subjective:    Patient ID: Christopher Gregory, male    DOB: May 07, 1956, 66 y.o.   MRN: [id number]  Chief Complaint  Patient presents with   Follow-up    Blood clot in right calf. Patient states it has been really painful/mainly behind the knee.     HPI Patient is in today for pain behind the right knee on the upper posterior thigh area.  He says he has had DVTs before but it is never been this painful for this long.  The pain got progressively worse over a 3-week.  About a month prior to that he had actually been in a brace on that leg for a displaced hip for about 6 weeks.  But he had been active and had been playing racquetball and doing well until this occurred.  He did start the Eliquis.  He is tolerating it well thus far but he has noticed a little bit of pain over his left low back area just below the rib cage.  He is not sure if it is related to the medication or not.  He is also here today because he is having a lot of pain and discomfort he says its most comfortable when it feels elevated but if he gets up and tries to walk its more painful.  He said even just trying to pick up a few sticks in the yard was difficult.  Been using the tramadol twice a day for pain.  He does find this helpful.  He did skip taking it this morning because it does cause some sedation.  "IMPRESSION: 1. The examination is positive for nonocclusive wall thickening/DVT involving the mid and distal aspects of the right femoral vein as well as occlusive DVT involving the right posterior tibial and peroneal veins. 2. The examination is also positive for occlusive SVT involving the right gastrocnemius vein."  Past Medical History:  Diagnosis Date   Allergy    Gout    Hypertension    Thyroid disease     Past Surgical History:  Procedure Laterality Date   KNEE ARTHROSCOPY     TOTAL HIP ARTHROPLASTY  2000   TOTAL HIP ARTHROPLASTY  2004    Family History  Problem Relation Age of Onset    Hypertension Mother    Hyperlipidemia Father     Social History   Socioeconomic History   Marital status: Married    Spouse name: Katharine Look   Number of children: 2   Years of education: 80   Highest education level: Futures trader degree: occupational, Hotel manager, or vocational program  Occupational History   Occupation: ATM Tech    Comment: [employer] Education administrator: [employer]   Occupation: Retired  Tobacco Use   Smoking status: Former    Types: Cigarettes    Quit date: 11/26/1973    Years since quitting: 48.0   Smokeless tobacco: Never  Vaping Use   Vaping Use: Never used  Substance and Sexual Activity   Alcohol use: No   Drug use: No   Sexual activity: Yes    Partners: Female  Other Topics Concern   Not on file  Social History Narrative   Lives with his wife and has court custody of great grand daughter. Enjoys playing racquetball and going to the gym four times a week.   Social Determinants of Health   Financial Resource Strain: Low Risk    Difficulty of Paying Living Expenses: Not hard at all  Food  Insecurity: No Food Insecurity   Worried About Charity fundraiser in the Last Year: Never true   Ran Out of Food in the Last Year: Never true  Transportation Needs: No Transportation Needs   Lack of Transportation (Medical): No   Lack of Transportation (Non-Medical): No  Physical Activity: Sufficiently Active   Days of Exercise per Week: 4 days   Minutes of Exercise per Session: 150+ min  Stress: No Stress Concern Present   Feeling of Stress : Not at all  Social Connections: Moderately Integrated   Frequency of Communication with Friends and Family: Once a week   Frequency of Social Gatherings with Friends and Family: Never   Attends Religious Services: More than 4 times per year   Active Member of [employer] or Organizations: Yes   Attends Music therapist: More than 4 times per year   Marital Status: Married  Human resources officer Violence: Not At Risk   Fear  of Current or Ex-Partner: No   Emotionally Abused: No   Physically Abused: No   Sexually Abused: No    Outpatient Medications Prior to Visit  Medication Sig Dispense Refill   allopurinol (ZYLOPRIM) 100 MG tablet Take 1 tablet (100 mg total) by mouth daily. 90 tablet 1   amLODipine (NORVASC) 5 MG tablet Take 1 tablet (5 mg total) by mouth daily. 90 tablet 1   apixaban (ELIQUIS) 5 MG TABS tablet 2 tabs p.o. twice daily for 7 days then 1 tab p.o. twice daily 60 tablet 5   atenolol (TENORMIN) 100 MG tablet Take 1 tablet (100 mg total) by mouth daily. 90 tablet 1   Azelastine HCl 0.15 % SOLN INHALE 1 SPRAY INTO BOTH NOSTRILS TWICE A DAY 90 mL 1   Black Pepper-Turmeric (TURMERIC COMPLEX/BLACK PEPPER PO) Take 800 mg by mouth 2 (two) times daily.     cetirizine (ZYRTEC) 10 MG tablet Take 10 mg by mouth daily.     Cholecalciferol (VITAMIN D PO) Take 30,000 Int'l Units by mouth 2 (two) times a week. Monday,Thursday     Coenzyme Q10-Red Yeast Rice (CO Q-10 PLUS RED YEAST RICE PO) Take 2 capsules by mouth daily.     colchicine 0.6 MG tablet Take 1 tablet (0.6 mg total) by mouth daily as needed. 30 tablet 1   diphenhydrAMINE (SIMPLY SLEEP) 25 MG tablet Take 25 mg by mouth at bedtime as needed for sleep.     dorzolamide-timolol (COSOPT) 22.3-6.8 MG/ML ophthalmic solution Place 1 drop into both eyes 2 (two) times daily.     furosemide (LASIX) 20 MG tablet Take 1 tablet (20 mg total) by mouth daily. 90 tablet 1   Ginger 500 MG CAPS Take by mouth daily.     latanoprost (XALATAN) 0.005 % ophthalmic solution 1 drop at bedtime.     Lavender Oil OIL at bedtime as needed.     levothyroxine (SYNTHROID) 50 MCG tablet Take 1 tablet (50 mcg total) by mouth daily. 90 tablet 1   Menaquinone-7 (VITAMIN K2 PO) Take 300 mg by mouth daily.     spironolactone (ALDACTONE) 25 MG tablet Take 1 tablet (25 mg total) by mouth daily. 90 tablet 1   traMADol (ULTRAM) 50 MG tablet Take 1 tablet (50 mg total) by mouth every 8  (eight) hours as needed for moderate pain. 21 tablet 0   No facility-administered medications prior to visit.    Allergies  Allergen Reactions   Neomycin     Hives   Ace Inhibitors Other (See  Comments)    Allergic reaction   Amlodipine     Swelling on 63m dose   Prednisone Other (See Comments)    Urgent BP and inc blood glucose   Erythromycin Rash    Review of Systems     Objective:    Physical Exam Vitals reviewed.  Constitutional:      Appearance: He is well-developed.  HENT:     Head: Normocephalic and atraumatic.  Eyes:     Conjunctiva/sclera: Conjunctivae normal.  Cardiovascular:     Rate and Rhythm: Normal rate.  Pulmonary:     Effort: Pulmonary effort is normal.  Musculoskeletal:     Comments: Nontender over the left low back area between the rib cage and the hip.  No bruising or hematoma etc.  Skin:    General: Skin is dry.     Coloration: Skin is not pale.  Neurological:     Mental Status: He is alert and oriented to person, place, and time.  Psychiatric:        Behavior: Behavior normal.    BP (!) 155/92 (BP Location: Left Arm)    Pulse 69    Resp 18    Ht 5' 5" (1.651 m)    Wt 162 lb (73.5 kg)    SpO2 98%    BMI 26.96 kg/m  Wt Readings from Last 3 Encounters:  12/19/21 162 lb (73.5 kg)  11/12/21 161 lb (73 kg)  05/13/21 159 lb (72.1 kg)    Health Maintenance Due  Topic Date Due   COVID-19 Vaccine (1) Never done   TETANUS/TDAP  11/29/2021    There are no preventive care reminders to display for this patient.   Lab Results  Component Value Date   TSH 2.64 11/12/2021   Lab Results  Component Value Date   WBC 7.6 05/13/2021   HGB 16.9 05/13/2021   HCT 51.3 (H) 05/13/2021   MCV 89.1 05/13/2021   PLT 256 05/13/2021   Lab Results  Component Value Date   NA 141 11/21/2021   K 4.2 11/21/2021   CO2 25 11/21/2021   GLUCOSE 127 (H) 11/21/2021   BUN 33 (H) 11/21/2021   CREATININE 1.65 (H) 11/21/2021   BILITOT 1.8 (H) 11/21/2021    ALKPHOS 33 (L) 12/28/2016   AST 27 11/21/2021   ALT 16 11/21/2021   PROT 6.2 11/21/2021   ALBUMIN 4.1 12/28/2016   CALCIUM 9.5 11/21/2021   EGFR 45 (L) 11/21/2021   Lab Results  Component Value Date   CHOL 165 05/13/2021   Lab Results  Component Value Date   HDL 35 (L) 05/13/2021   Lab Results  Component Value Date   LDLCALC 90 05/13/2021   Lab Results  Component Value Date   TRIG 167 (H) 11/12/2021   Lab Results  Component Value Date   CHOLHDL 4.7 05/13/2021   Lab Results  Component Value Date   HGBA1C 5.7 (H) 11/12/2021       Assessment & Plan:   Problem List Items Addressed This Visit       Cardiovascular and Mediastinum   Right leg DVT femoral and tibial veins   Relevant Orders   UKoreaVenous Img Lower Unilateral Right   Essential hypertension, benign - Primary   Relevant Orders   CBC with Differential/Platelet   COMPLETE METABOLIC PANEL WITH GFR   Urinalysis, Routine w reflex microscopic   Other Visit Diagnoses     Left flank pain       Relevant Orders  CBC with Differential/Platelet   COMPLETE METABOLIC PANEL WITH GFR   Urinalysis, Routine w reflex microscopic      Right leg DVT-it looks like he has had 2 DVTs and SVT which is most likely why he is having more severe and significant pain he does feel that the tramadol is helpful with.  We discussed that if after the weekend he feels like he still needing pain medication that I am more than happy to fill the medication for him for refill.  Continue to be careful because of the sedation effect.  We discussed also getting an ultrasound next week if he is continue to have significant pain just to follow-up and make sure that the clot is not getting worse or extending.  Left flank pain-does have a prior history of kidney stones.  No pain directly over the kidney.  I suspect this is probably coming more from his back.  He wonders if it could be from the Eliquis but I think that is less likely.  We will do a  urinalysis today just to check for abnormalities as well as a CBC with differential  No orders of the defined types were placed in this encounter.    Beatrice Lecher, MD

## 2021-12-20 LAB — URINALYSIS, ROUTINE W REFLEX MICROSCOPIC
Bacteria, UA: NONE SEEN /HPF
Bilirubin Urine: NEGATIVE
Glucose, UA: NEGATIVE
Hyaline Cast: NONE SEEN /LPF
Ketones, ur: NEGATIVE
Leukocytes,Ua: NEGATIVE
Nitrite: NEGATIVE
Specific Gravity, Urine: 1.01 (ref 1.001–1.035)
Squamous Epithelial / HPF: NONE SEEN /HPF (ref <=5)
WBC, UA: NONE SEEN /HPF (ref 0–5)
pH: 5.5 (ref 5.0–8.0)

## 2021-12-20 LAB — COMPLETE METABOLIC PANEL WITH GFR
AG Ratio: 1.7 (calc) (ref 1.0–2.5)
ALT: 14 U/L (ref 9–46)
AST: 14 U/L (ref 10–35)
Albumin: 4.1 g/dL (ref 3.6–5.1)
Alkaline phosphatase (APISO): 48 U/L (ref 35–144)
BUN/Creatinine Ratio: 18 (calc) (ref 6–22)
BUN: 31 mg/dL — ABNORMAL HIGH (ref 7–25)
CO2: 28 mmol/L (ref 20–32)
Calcium: 9.6 mg/dL (ref 8.6–10.3)
Chloride: 104 mmol/L (ref 98–110)
Creat: 1.73 mg/dL — ABNORMAL HIGH (ref 0.70–1.35)
Globulin: 2.4 g/dL (calc) (ref 1.9–3.7)
Glucose, Bld: 97 mg/dL (ref 65–99)
Potassium: 5.1 mmol/L (ref 3.5–5.3)
Sodium: 140 mmol/L (ref 135–146)
Total Bilirubin: 1.2 mg/dL (ref 0.2–1.2)
Total Protein: 6.5 g/dL (ref 6.1–8.1)
eGFR: 43 mL/min/{1.73_m2} — ABNORMAL LOW (ref >=60)

## 2021-12-20 LAB — CBC WITH DIFFERENTIAL/PLATELET
Absolute Monocytes: 768 cells/uL (ref 200–950)
Basophils Absolute: 81 cells/uL (ref 0–200)
Basophils Relative: 0.8 %
Eosinophils Absolute: 283 cells/uL (ref 15–500)
Eosinophils Relative: 2.8 %
HCT: 53.4 % — ABNORMAL HIGH (ref 38.5–50.0)
Hemoglobin: 17.8 g/dL — ABNORMAL HIGH (ref 13.2–17.1)
Lymphs Abs: 1959 cells/uL (ref 850–3900)
MCH: 29.5 pg (ref 27.0–33.0)
MCHC: 33.3 g/dL (ref 32.0–36.0)
MCV: 88.4 fL (ref 80.0–100.0)
MPV: 9.3 fL (ref 7.5–12.5)
Monocytes Relative: 7.6 %
Neutro Abs: 7009 cells/uL (ref 1500–7800)
Neutrophils Relative %: 69.4 %
Platelets: 284 10*3/uL (ref 140–400)
RBC: 6.04 10*6/uL — ABNORMAL HIGH (ref 4.20–5.80)
RDW: 13.1 % (ref 11.0–15.0)
Total Lymphocyte: 19.4 %
WBC: 10.1 10*3/uL (ref 3.8–10.8)

## 2021-12-20 LAB — MICROSCOPIC MESSAGE

## 2021-12-23 ENCOUNTER — Other Ambulatory Visit: Payer: Self-pay

## 2021-12-23 DIAGNOSIS — N1831 Chronic kidney disease, stage 3a: Secondary | ICD-10-CM

## 2021-12-23 NOTE — Progress Notes (Signed)
Christopher Gregory, hemoglobin is back up at 17.8.  White blood cells are normal so no sign of infection.  Urine is showing 2+ hemoglobin and 2+ protein similar to about a year ago.  In about 10-20 red blood cells in the urine which is a little bit more than a year ago.  Kidney function is up just slightly at 1.7 normal you are more like 1.5-1.6 so I do want to keep an eye on that and plan to recheck your serum creatinine again in about 3 to 4 weeks.  Calcium and liver enzymes look good

## 2021-12-25 ENCOUNTER — Ambulatory Visit (INDEPENDENT_AMBULATORY_CARE_PROVIDER_SITE_OTHER): Payer: Medicare HMO

## 2021-12-25 ENCOUNTER — Other Ambulatory Visit: Payer: Self-pay

## 2021-12-25 DIAGNOSIS — I82411 Acute embolism and thrombosis of right femoral vein: Secondary | ICD-10-CM

## 2021-12-25 DIAGNOSIS — M79604 Pain in right leg: Secondary | ICD-10-CM | POA: Diagnosis not present

## 2021-12-25 DIAGNOSIS — I82441 Acute embolism and thrombosis of right tibial vein: Secondary | ICD-10-CM | POA: Diagnosis not present

## 2021-12-25 DIAGNOSIS — I824Z1 Acute embolism and thrombosis of unspecified deep veins of right distal lower extremity: Secondary | ICD-10-CM | POA: Diagnosis not present

## 2021-12-25 NOTE — Progress Notes (Signed)
Hi Rea, no major changes on the ultrasound which is very reassuring nothing new.  Continue to work on just gentle movements and stretching.  Continue to take the blood thinner.  If you are still having a lot of pain over the next 2 to 3 weeks then please let me know.  Also let us know if your blood pressures been improving.

## 2022-01-01 ENCOUNTER — Telehealth: Payer: Self-pay | Admitting: *Deleted

## 2022-01-01 NOTE — Chronic Care Management (AMB) (Signed)
°  Care Management   Note  01/01/2022 Name: Dimas Scheck MRN: [id number] DOB: 03/25/1956  Archibald Marchetta is a 66 y.o. year old male who is a primary care patient of Agapito Games, MD and is actively engaged with the care management team. I reached out to [name] by phone today to assist with re-scheduling a follow up visit with the Pharmacist  Follow up plan: Unsuccessful telephone outreach attempt made. A HIPAA compliant phone message was left for the patient providing contact information and requesting a return call.   Burman Nieves, CCMA Care Guide, Embedded Care Coordination [facility]   Care Management  Direct Dial: [phone]

## 2022-01-02 ENCOUNTER — Telehealth: Payer: Self-pay

## 2022-01-02 DIAGNOSIS — M7989 Other specified soft tissue disorders: Secondary | ICD-10-CM

## 2022-01-02 DIAGNOSIS — I824Y1 Acute embolism and thrombosis of unspecified deep veins of right proximal lower extremity: Secondary | ICD-10-CM

## 2022-01-02 NOTE — Telephone Encounter (Signed)
Patient called stating he is feeling much better and would like to know if he should stop or continue with the Eliquis? Forward to Dr. Linford Arnold.

## 2022-01-02 NOTE — Chronic Care Management (AMB) (Signed)
°  Care Management   Note  01/02/2022 Name: Christopher Gregory MRN: [id number] DOB: 01/27/1956  Christopher Gregory is a 66 y.o. year old male who is a primary care patient of Agapito Games, MD and is actively engaged with the care management team. I reached out to [name] by phone today to assist with re-scheduling a follow up visit with the Pharmacist  Follow up plan: Telephone appointment with care management team member scheduled for: 04/08/2022  Burman Nieves, CCMA Care Guide, Embedded Care Coordination [facility]   Care Management  Direct Dial: [phone]

## 2022-01-06 NOTE — Telephone Encounter (Signed)
Wanting to continue treatment for 6 months and at that point we can reassess.

## 2022-01-07 MED ORDER — APIXABAN 5 MG PO TABS: 5.0000 mg | ORAL_TABLET | Freq: Two times a day (BID) | ORAL | 1 refills | Status: DC

## 2022-01-07 NOTE — Addendum Note (Signed)
Addended by: Chalmers Cater on: 01/07/2022 02:14 PM ? ? Modules accepted: Orders ? ?

## 2022-01-07 NOTE — Telephone Encounter (Signed)
Patient's wife advised

## 2022-02-12 ENCOUNTER — Telehealth: Payer: Medicare HMO

## 2022-04-03 DIAGNOSIS — H43811 Vitreous degeneration, right eye: Secondary | ICD-10-CM | POA: Diagnosis not present

## 2022-04-03 DIAGNOSIS — H401111 Primary open-angle glaucoma, right eye, mild stage: Secondary | ICD-10-CM | POA: Diagnosis not present

## 2022-04-03 DIAGNOSIS — H02403 Unspecified ptosis of bilateral eyelids: Secondary | ICD-10-CM | POA: Diagnosis not present

## 2022-04-03 DIAGNOSIS — H401123 Primary open-angle glaucoma, left eye, severe stage: Secondary | ICD-10-CM | POA: Diagnosis not present

## 2022-04-03 DIAGNOSIS — H25813 Combined forms of 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, bilateral: Secondary | ICD-10-CM | POA: Diagnosis not present

## 2022-04-03 DIAGNOSIS — H527 Unspecified disorder of refraction: Secondary | ICD-10-CM | POA: Diagnosis not present

## 2022-04-08 ENCOUNTER — Telehealth: Payer: Medicare HMO

## 2022-04-08 NOTE — Progress Notes (Deleted)
Chronic Care Management Pharmacy Note  04/08/2022 Name:  Christopher Gregory MRN:  [id number] DOB:  Dec 01, 1955  Summary: addressed HTN, HLD, and impaired fasting glucose. Discussed fish oil for elevated triglycerides but patient states has not worked for him in the past. Discussed lifestyle modifications and diet choices for other opportunities to lower TG.  Recommendations/Changes made from today's visit: none - monitor potassium (consider dose decrease spironolactone, 5.1 in 12/19/21 labs) - dc Vit K2  Plan: f/u with pharmacist in 6 months  Subjective: Christopher Gregory is an 66 y.o. year old male who is a primary patient of Metheney, Rene Kocher, MD.  The CCM team was consulted for assistance with disease management and care coordination needs.    Engaged with patient by telephone for follow up visit in response to provider referral for pharmacy case management and/or care coordination services.   Consent to Services:  The patient was given information about Chronic Care Management services, agreed to services, and gave verbal consent prior to initiation of services.  Please see initial visit note for detailed documentation.   Patient Care Team: Hali Marry, MD as PCP - General (Family Medicine) Dr. Rolley Sims (Ophthalmology) Adegoroye, Wynona Luna, MD as Referring Physician (Specialist) Darius Bump, [facility] as Pharmacist (Pharmacist)  Recent office visits:  05/13/21 Beatrice Lecher MD PCP- pt was seen for his annual wellness exam. Labs were ordered and provider discontinued Benzonatate, Guaifenesin Codeine, and Netarsudil Latanoprost. Follow up in 6 months.   Recent consult visits:  N/A   Hospital visits:  None in previous 6 months  Objective:  Lab Results  Component Value Date   CREATININE 1.73 (H) 12/19/2021   CREATININE 1.65 (H) 11/21/2021   CREATININE 1.51 (H) 11/12/2021    Lab Results  Component Value Date   HGBA1C 5.7 (H) 11/12/2021   Last diabetic Eye exam:   Lab Results  Component Value Date/Time   HMDIABEYEEXA No Retinopathy 03/27/2021 12:00 AM    Last diabetic Foot exam: No results found for: HMDIABFOOTEX      Component Value Date/Time   CHOL 165 05/13/2021 0000   TRIG 167 (H) 11/12/2021 0000   HDL 35 (L) 05/13/2021 0000   CHOLHDL 4.7 05/13/2021 0000   VLDL 28 11/25/2015 0925   LDLCALC 90 05/13/2021 0000       Latest Ref Rng & Units 12/19/2021   12:00 AM 11/21/2021   12:00 AM 11/12/2021   12:00 AM  Hepatic Function  Total Protein 6.1 - 8.1 g/dL 6.5   6.2   7.2    AST 10 - 35 U/L 14   27   19     ALT 9 - 46 U/L 14   16   18     Total Bilirubin 0.2 - 1.2 mg/dL 1.2   1.8   1.3      Lab Results  Component Value Date/Time   TSH 2.64 11/12/2021 12:00 AM   TSH 1.07 05/13/2021 12:00 AM       Latest Ref Rng & Units 12/19/2021   12:00 AM 05/13/2021   12:00 AM 11/12/2020    3:05 PM  CBC  WBC 3.8 - 10.8 Thousand/uL 10.1   7.6   10.5    Hemoglobin 13.2 - 17.1 g/dL 17.8   16.9   16.2    Hematocrit 38.5 - 50.0 % 53.4   51.3   47.6    Platelets 140 - 400 Thousand/uL 284   256   283      Lab Results  Component Value Date/Time   VD25OH 127 (H) 05/13/2021 12:00 AM   VD25OH 131 (H) 11/12/2020 03:05 PM    Clinical ASCVD: No  The 10-year ASCVD risk score (Arnett DK, et al., 2019) is: 24.6%   Values used to calculate the score:     Age: 66 years     Sex: Male     Is Non-Hispanic African American: No     Diabetic: No     Tobacco smoker: No     Systolic Blood Pressure: 99991111 mmHg     Is BP treated: Yes     HDL Cholesterol: 35 mg/dL     Total Cholesterol: 165 mg/dL    Other: (CHADS2VASc if Afib, PHQ9 if depression, MMRC or CAT for COPD, ACT, DEXA)  Social History   Tobacco Use  Smoking Status Former   Types: Cigarettes   Quit date: 11/26/1973   Years since quitting: 48.3  Smokeless Tobacco Never   BP Readings from Last 3 Encounters:  12/19/21 (!) 155/92  11/12/21 128/66  05/13/21 137/66   Pulse Readings from Last 3  Encounters:  12/19/21 69  11/12/21 (!) 50  05/13/21 (!) 49   Wt Readings from Last 3 Encounters:  12/19/21 162 lb (73.5 kg)  11/12/21 161 lb (73 kg)  05/13/21 159 lb (72.1 kg)    Assessment: Review of patient past medical history, allergies, medications, health status, including review of consultants reports, laboratory and other test data, was performed as part of comprehensive evaluation and provision of chronic care management services.   SDOH:  (Social Determinants of Health) assessments and interventions performed:    CCM Care Plan  Allergies  Allergen Reactions   Neomycin     Hives   Ace Inhibitors Other (See Comments)    Allergic reaction   Amlodipine     Swelling on 10mg  dose   Prednisone Other (See Comments)    Urgent BP and inc blood glucose   Erythromycin Rash    Medications Reviewed Today     Reviewed by Izora Gala, CMA (Certified Medical Assistant) on 12/19/21 at 1525  Med List Status: <None>   Medication Order Taking? Sig Documenting Provider Last Dose Status Informant  allopurinol (ZYLOPRIM) 100 MG tablet [id number] No Take 1 tablet (100 mg total) by mouth daily. Hali Marry, MD Taking Active   amLODipine (NORVASC) 5 MG tablet [id number] No Take 1 tablet (5 mg total) by mouth daily. Hali Marry, MD Taking Active   apixaban (ELIQUIS) 5 MG TABS tablet [id number]  2 tabs p.o. twice daily for 7 days then 1 tab p.o. twice daily Silverio Decamp, MD  Active   atenolol (TENORMIN) 100 MG tablet [id number] No Take 1 tablet (100 mg total) by mouth daily. Hali Marry, MD Taking Active   Azelastine HCl 0.15 % SOLN [id number] No INHALE 1 SPRAY INTO BOTH NOSTRILS TWICE A DAY Hali Marry, MD Taking Active   Black Pepper-Turmeric (TURMERIC COMPLEX/BLACK PEPPER PO) [id number] No Take 800 mg by mouth 2 (two) times daily. [provider] Taking Active   cetirizine (ZYRTEC) 10 MG tablet [id number] No Take 10 mg by mouth daily.  [provider] Taking Active   Cholecalciferol (VITAMIN D PO) [id number] No Take 30,000 Int'l Units by mouth 2 (two) times a week. Monday,Thursday [provider] Taking Active Self  Coenzyme Q10-Red Yeast Rice (CO Q-10 PLUS RED YEAST RICE PO) [id number] No Take 2 capsules by mouth daily. [provider] Taking Active   colchicine  0.6 MG tablet [id number] No Take 1 tablet (0.6 mg total) by mouth daily as needed. Hali Marry, MD Taking Active   diphenhydrAMINE (SIMPLY SLEEP) 25 MG tablet [id number] No Take 25 mg by mouth at bedtime as needed for sleep. [provider] Taking Active   dorzolamide-timolol (COSOPT) 22.3-6.8 MG/ML ophthalmic solution [id number] No Place 1 drop into both eyes 2 (two) times daily. [provider] Taking Active   furosemide (LASIX) 20 MG tablet [id number] No Take 1 tablet (20 mg total) by mouth daily. Hali Marry, MD Taking Active   Ginger 500 MG CAPS [id number] No Take by mouth daily. [provider] Taking Active   latanoprost (XALATAN) 0.005 % ophthalmic solution [id number] No 1 drop at bedtime. [provider] Taking Active   Lavender Oil OIL [id number] No at bedtime as needed. [provider] Taking Active   levothyroxine (SYNTHROID) 50 MCG tablet [id number] No Take 1 tablet (50 mcg total) by mouth daily. Hali Marry, MD Taking Active   Menaquinone-7 (VITAMIN K2 PO) [id number] No Take 300 mg by mouth daily. [provider] Taking Active Self  spironolactone (ALDACTONE) 25 MG tablet [id number] No Take 1 tablet (25 mg total) by mouth daily. Hali Marry, MD Taking Active   traMADol Veatrice Bourbon) 50 MG tablet [id number]  Take 1 tablet (50 mg total) by mouth every 8 (eight) hours as needed for moderate pain. 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   Microscopic polyangiitis ([place]) 02/10/2018   AKI (acute kidney injury) ([place]) 02/10/2018   Primary open-angle glaucoma 12/27/2017   Combined form of 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, left eye 12/27/2017   Corneal edema of left eye 12/27/2017   Acquired deviated nasal septum 12/23/2016   Allergic rhinitis 01/03/2016   Primary open angle glaucoma (POAG) of both eyes 01/03/2016   Other long term (current) drug therapy 01/03/2016   HLD (hyperlipidemia) 01/03/2016   Elevated fasting blood sugar 01/03/2016   Calculus of kidney 01/03/2016   Left lumbar radiculopathy 04/26/2014   Essential hypertension, benign 05/26/2013   Kidney stone 05/26/2013   Avitaminosis D 11/28/2012    Immunization History  Administered Date(s) Administered   Tdap 11/09/2008, 11/30/2011   Zoster, Live 02/08/2016    Conditions to be addressed/monitored: HTN, HLD, and impaired fasting glucose  There are no care plans that you recently modified to display for this patient.   Medication Assistance: None required.  Patient affirms current coverage meets needs.  Patient's preferred pharmacy is:  [facility], [place] [address] Phone: [phone] Fax: [phone]  [id number] [address] [place] [address] Phone: [phone] Fax: [phone]  Uses pill box? Yes Pt endorses 100% compliance  Follow Up:  Patient agrees to Care Plan and  Follow-up.  Plan: Telephone follow up appointment with care management  team member scheduled for:  6 months  Darius Bump

## 2022-04-15 ENCOUNTER — Ambulatory Visit (INDEPENDENT_AMBULATORY_CARE_PROVIDER_SITE_OTHER): Payer: Medicare HMO | Admitting: Pharmacist

## 2022-04-15 DIAGNOSIS — I10 Essential (primary) hypertension: Secondary | ICD-10-CM

## 2022-04-15 DIAGNOSIS — R7301 Impaired fasting glucose: Secondary | ICD-10-CM

## 2022-04-15 DIAGNOSIS — E782 Mixed hyperlipidemia: Secondary | ICD-10-CM

## 2022-04-15 NOTE — Patient Instructions (Signed)
Visit Information  Thank you for taking time to visit with me today. Please don't hesitate to contact me if I can be of assistance to you before our next scheduled telephone appointment.  Following are the goals we discussed today:  Patient Goals/Self-Care Activities Over the next 90 days, patient will:  take medications as prescribed  Follow Up Plan: Telephone follow up appointment with care management team member scheduled for:  3-6 months  Please call the care guide team at [phone] if you need to cancel or reschedule your appointment.   Patient verbalizes understanding of instructions and care plan provided today and agrees to view in [place]. Active MyChart status and patient understanding of how to access instructions and care plan via MyChart confirmed with patient.     Darius Bump

## 2022-04-15 NOTE — Progress Notes (Signed)
Chronic Care Management Pharmacy Note  04/15/2022 Name:  Christopher Gregory MRN:  [id number] DOB:  [birth year]  Summary: addressed HTN, HLD, and impaired fasting glucose. Discussed his blood clots, treated with Eliquis.   BP readings at home are 120/70s, though white coat hypertension impacts our office readings. He goes to gym 4 days per week, and is doing well with this lifestyle.  Recommendations/Changes made from today's visit: none - Monitor potassium (consider dose decrease spironolactone if K remains >5 at July 2023 PCP visit & labwork)  - Recommended reduce his intake of Vitamin K2, which may be acting as a procoagulant at his current doses.  Plan: f/u with pharmacist in 3-6 months  Subjective: Christopher Gregory is an 66 y.o. year old male who is a primary patient of Metheney, Rene Kocher, MD.  The CCM team was consulted for assistance with disease management and care coordination needs.    Engaged with patient by telephone for follow up visit in response to provider referral for pharmacy case management and/or care coordination services.   Consent to Services:  The patient was given information about Chronic Care Management services, agreed to services, and gave verbal consent prior to initiation of services.  Please see initial visit note for detailed documentation.   Patient Care Team: Hali Marry, MD as PCP - General (Family Medicine) Dr. Rolley Sims (Ophthalmology) Adegoroye, Wynona Luna, MD as Referring Physician (Specialist) Darius Bump, [facility] as Pharmacist (Pharmacist)  Recent office visits:  05/13/21 Beatrice Lecher MD PCP- pt was seen for his annual wellness exam. Labs were ordered and provider discontinued Benzonatate, Guaifenesin Codeine, and Netarsudil Latanoprost. Follow up in 6 months.   Recent consult visits:  N/A   Hospital visits:  None in previous 6 months  Objective:  Lab Results  Component Value Date   CREATININE 1.73 (H) 12/19/2021   CREATININE  1.65 (H) 11/21/2021   CREATININE 1.51 (H) 11/12/2021    Lab Results  Component Value Date   HGBA1C 5.7 (H) 11/12/2021   Last diabetic Eye exam:  Lab Results  Component Value Date/Time   HMDIABEYEEXA No Retinopathy 03/27/2021 12:00 AM    Last diabetic Foot exam: No results found for: HMDIABFOOTEX      Component Value Date/Time   CHOL 165 05/13/2021 0000   TRIG 167 (H) 11/12/2021 0000   HDL 35 (L) 05/13/2021 0000   CHOLHDL 4.7 05/13/2021 0000   VLDL 28 11/25/2015 0925   LDLCALC 90 05/13/2021 0000       Latest Ref Rng & Units 12/19/2021   12:00 AM 11/21/2021   12:00 AM 11/12/2021   12:00 AM  Hepatic Function  Total Protein 6.1 - 8.1 g/dL 6.5   6.2   7.2    AST 10 - 35 U/L 14   27   19     ALT 9 - 46 U/L 14   16   18     Total Bilirubin 0.2 - 1.2 mg/dL 1.2   1.8   1.3      Lab Results  Component Value Date/Time   TSH 2.64 11/12/2021 12:00 AM   TSH 1.07 05/13/2021 12:00 AM       Latest Ref Rng & Units 12/19/2021   12:00 AM 05/13/2021   12:00 AM 11/12/2020    3:05 PM  CBC  WBC 3.8 - 10.8 Thousand/uL 10.1   7.6   10.5    Hemoglobin 13.2 - 17.1 g/dL 17.8   16.9   16.2    Hematocrit 38.5 -  50.0 % 53.4   51.3   47.6    Platelets 140 - 400 Thousand/uL 284   256   283      Lab Results  Component Value Date/Time   VD25OH 127 (H) 05/13/2021 12:00 AM   VD25OH 131 (H) 11/12/2020 03:05 PM    Clinical ASCVD: No  The 10-year ASCVD risk score (Arnett DK, et al., 2019) is: 24.6%   Values used to calculate the score:     Age: 20 years     Sex: Male     Is Non-Hispanic African American: No     Diabetic: No     Tobacco smoker: No     Systolic Blood Pressure: 99991111 mmHg     Is BP treated: Yes     HDL Cholesterol: 35 mg/dL     Total Cholesterol: 165 mg/dL    Other: (CHADS2VASc if Afib, PHQ9 if depression, MMRC or CAT for COPD, ACT, DEXA)  Social History   Tobacco Use  Smoking Status Former   Types: Cigarettes   Quit date: 11/26/1973   Years since quitting: 48.4  Smokeless  Tobacco Never   BP Readings from Last 3 Encounters:  12/19/21 (!) 155/92  11/12/21 128/66  05/13/21 137/66   Pulse Readings from Last 3 Encounters:  12/19/21 69  11/12/21 (!) 50  05/13/21 (!) 49   Wt Readings from Last 3 Encounters:  12/19/21 162 lb (73.5 kg)  11/12/21 161 lb (73 kg)  05/13/21 159 lb (72.1 kg)    Assessment: Review of patient past medical history, allergies, medications, health status, including review of consultants reports, laboratory and other test data, was performed as part of comprehensive evaluation and provision of chronic care management services.   SDOH:  (Social Determinants of Health) assessments and interventions performed:    CCM Care Plan  Allergies  Allergen Reactions   Neomycin     Hives   Ace Inhibitors Other (See Comments)    Allergic reaction   Amlodipine     Swelling on 10mg  dose   Prednisone Other (See Comments)    Urgent BP and inc blood glucose   Erythromycin Rash    Medications Reviewed Today     Reviewed by Izora Gala, CMA (Certified Medical Assistant) on 12/19/21 at 1525  Med List Status: <None>   Medication Order Taking? Sig Documenting Provider Last Dose Status Informant  allopurinol (ZYLOPRIM) 100 MG tablet [id number] No Take 1 tablet (100 mg total) by mouth daily. Hali Marry, MD Taking Active   amLODipine (NORVASC) 5 MG tablet [id number] No Take 1 tablet (5 mg total) by mouth daily. Hali Marry, MD Taking Active   apixaban (ELIQUIS) 5 MG TABS tablet [id number]  2 tabs p.o. twice daily for 7 days then 1 tab p.o. twice daily Silverio Decamp, MD  Active   atenolol (TENORMIN) 100 MG tablet [id number] No Take 1 tablet (100 mg total) by mouth daily. Hali Marry, MD Taking Active   Azelastine HCl 0.15 % SOLN [id number] No INHALE 1 SPRAY INTO BOTH NOSTRILS TWICE A DAY Hali Marry, MD Taking Active   Black Pepper-Turmeric (TURMERIC COMPLEX/BLACK PEPPER PO) [id number] No Take 800 mg  by mouth 2 (two) times daily. [provider] Taking Active   cetirizine (ZYRTEC) 10 MG tablet [id number] No Take 10 mg by mouth daily. [provider] Taking Active   Cholecalciferol (VITAMIN D PO) [id number] No Take 30,000 Int'l Units by mouth 2 (two) times a week. Monday,Thursday [provider] Taking Active Self  Coenzyme Q10-Red Yeast Rice (CO Q-10 PLUS RED YEAST RICE PO) [id number] No Take 2 capsules by mouth daily. [provider] Taking Active   colchicine 0.6 MG tablet [id number] No Take 1 tablet (0.6 mg total) by mouth daily as needed. Hali Marry, MD Taking Active   diphenhydrAMINE (SIMPLY SLEEP) 25 MG tablet [id number] No Take 25 mg by mouth at bedtime as needed for sleep. [provider] Taking Active   dorzolamide-timolol (COSOPT) 22.3-6.8 MG/ML ophthalmic solution [id number] No Place 1 drop into both eyes 2 (two) times daily. [provider] Taking Active   furosemide (LASIX) 20 MG tablet [id number] No Take 1 tablet (20 mg total) by mouth daily. Hali Marry, MD Taking Active   Ginger 500 MG CAPS [id number] No Take by mouth daily. [provider] Taking Active   latanoprost (XALATAN) 0.005 % ophthalmic solution [id number] No 1 drop at bedtime. [provider] Taking Active   Lavender Oil OIL [id number] No at bedtime as needed. [provider] Taking Active   levothyroxine (SYNTHROID) 50 MCG tablet [id number] No Take 1 tablet (50 mcg total) by mouth daily. Hali Marry, MD Taking Active   Menaquinone-7 (VITAMIN K2 PO) [id number] No Take 300 mg by mouth daily. [provider] Taking Active Self  spironolactone (ALDACTONE) 25 MG tablet [id number] No Take 1 tablet (25 mg total) by mouth daily. Hali Marry, MD Taking Active   traMADol Veatrice Bourbon) 50 MG tablet [id number]  Take 1 tablet (50 mg total) by mouth every 8 (eight) hours as needed for moderate pain. 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   Microscopic polyangiitis ([place]) 02/10/2018   AKI (acute kidney injury) ([place]) 02/10/2018   Primary open-angle glaucoma 12/27/2017   Combined form of 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, left eye 12/27/2017   Corneal edema of left eye 12/27/2017   Acquired deviated nasal septum 12/23/2016   Allergic rhinitis 01/03/2016   Primary open angle glaucoma (POAG) of both eyes 01/03/2016   Other long term (current) drug therapy 01/03/2016   HLD (hyperlipidemia) 01/03/2016   Elevated fasting blood sugar 01/03/2016   Calculus of kidney 01/03/2016   Left lumbar radiculopathy 04/26/2014   Essential hypertension, benign 05/26/2013   Kidney stone 05/26/2013   Avitaminosis D 11/28/2012    Immunization History  Administered Date(s) Administered   Tdap 11/09/2008, 11/30/2011   Zoster, Live 02/08/2016    Conditions to be addressed/monitored: HTN, HLD, and impaired fasting glucose  There are no care plans that you recently modified to display for this patient.   Medication Assistance: None required.  Patient affirms current coverage meets needs.  Patient's preferred pharmacy is:  [facility], [place] [address] Phone: [phone] Fax: [phone]  [id number] [address], [place] [address] Phone:  [phone] Fax: [phone]  Uses pill box? Yes Pt endorses 100% compliance  Follow Up:  Patient agrees to Care Plan and Follow-up.  Plan: Telephone follow up appointment with care management team member scheduled for:  3-6 months  Larinda Buttery, PharmD Clinical Pharmacist [facility] [phone]

## 2022-05-08 DIAGNOSIS — I10 Essential (primary) hypertension: Secondary | ICD-10-CM | POA: Diagnosis not present

## 2022-05-08 DIAGNOSIS — E785 Hyperlipidemia, unspecified: Secondary | ICD-10-CM | POA: Diagnosis not present

## 2022-05-13 ENCOUNTER — Encounter: Payer: Self-pay | Admitting: Family Medicine

## 2022-05-13 ENCOUNTER — Ambulatory Visit (INDEPENDENT_AMBULATORY_CARE_PROVIDER_SITE_OTHER): Payer: Medicare HMO | Admitting: Family Medicine

## 2022-05-13 VITALS — BP 155/73 | HR 48 | Resp 16 | Ht 65.0 in | Wt 162.0 lb

## 2022-05-13 DIAGNOSIS — M317 Microscopic polyangiitis: Secondary | ICD-10-CM

## 2022-05-13 DIAGNOSIS — E038 Other specified hypothyroidism: Secondary | ICD-10-CM

## 2022-05-13 DIAGNOSIS — R7301 Impaired fasting glucose: Secondary | ICD-10-CM | POA: Diagnosis not present

## 2022-05-13 DIAGNOSIS — I10 Essential (primary) hypertension: Secondary | ICD-10-CM | POA: Diagnosis not present

## 2022-05-13 DIAGNOSIS — E559 Vitamin D deficiency, unspecified: Secondary | ICD-10-CM | POA: Diagnosis not present

## 2022-05-13 NOTE — Assessment & Plan Note (Signed)
Due to recheck A1c. 

## 2022-05-13 NOTE — Patient Instructions (Signed)
Go ahead and cut the spironolactone in half. Monitor BP at home  for next 2-3 weeks and let me know how BP is doing.

## 2022-05-13 NOTE — Assessment & Plan Note (Signed)
Blood pressure is controlled at home per patient.  I really do want to get him off the spironolactone as he has had issues with elevated potassium levels.  Some to have a go ahead and cut that back down to half a tab in the monitor blood pressures at home.

## 2022-05-13 NOTE — Progress Notes (Signed)
Established Patient Office Visit  Subjective   Patient ID: Christopher Gregory, male    DOB: August 22, 1956  Age: 66 y.o. MRN: [id number]  Chief Complaint  Patient presents with   Hypertension    Follow up    Discuss Medication    Patient would like to discuss of he should continue on Eliquis. Patient has been off of Eliquis for 3 weeks.     HPI  Impaired fasting glucose-no increased thirst or urination. No symptoms consistent with hypoglycemia.  Hypertension- Pt denies chest pain, SOB, dizziness, or heart palpitations.  Taking meds as directed w/o problems.  Denies medication side effects.  Reports home blood pressures actually look great.  He says they are often in the 120s over 70s.  In fact this morning he says it was 123/71 at home he does monitor it regularly he goes to the gym almost every day.  F/U microscopic polyangiitis causing CKD 3.  Also history of crescentic glomerulonephritis.  Plan to recheck renal function today has not noticed any changes in urination.  Gout-since being back on allopurinol it has made a huge difference in his flares he was having them quite frequently.  He is using the colchicine much less frequently.  He also wanted to let me know for about the last 6 weeks he has been taking 2 teaspoons of "mothers vinegar" mixed with 2 teaspoons of lemon juice.  He read on line that it can be helpful with blood pressure and kidney function.     ROS    Objective:     BP (!) 155/73 (BP Location: Right Arm)   Pulse (!) 48   Resp 16   Ht 5\' 5"  (1.651 m)   Wt 162 lb (73.5 kg)   SpO2 99%   BMI 26.96 kg/m    Physical Exam Constitutional:      Appearance: He is well-developed.  HENT:     Head: Normocephalic and atraumatic.  Cardiovascular:     Rate and Rhythm: Normal rate and regular rhythm.     Heart sounds: Normal heart sounds.  Pulmonary:     Effort: Pulmonary effort is normal.     Breath sounds: Normal breath sounds.  Skin:    General: Skin is warm and  dry.  Neurological:     Mental Status: He is alert and oriented to person, place, and time.  Psychiatric:        Behavior: Behavior normal.      No results found for any visits on 05/13/22.    The 10-year ASCVD risk score (Arnett DK, et al., 2019) is: 24.6%    Assessment & Plan:   Problem List Items Addressed This Visit       Cardiovascular and Mediastinum   Microscopic polyangiitis (HCC)    He has declined going back to the kidney doctors so we will continue to monitor his levels.      Relevant Orders   Urine Microalbumin w/creat. ratio   VITAMIN D 25 Hydroxy (Vit-D Deficiency, Fractures)   Essential hypertension, benign - Primary    Blood pressure is controlled at home per patient.  I really do want to get him off the spironolactone as he has had issues with elevated potassium levels.  Some to have a go ahead and cut that back down to half a tab in the monitor blood pressures at home.      Relevant Orders   Urine Microalbumin w/creat. ratio   BASIC METABOLIC PANEL WITH GFR   Lipid  Panel w/reflex Direct LDL     Endocrine   IFG (impaired fasting glucose)    Due to recheck A1c.      Relevant Orders   Urine Microalbumin w/creat. ratio   BASIC METABOLIC PANEL WITH GFR   Lipid Panel w/reflex Direct LDL   Hypothyroidism    Due to recheck thyroid function.      Relevant Orders   TSH    No follow-ups on file.    Nani Gasser, MD

## 2022-05-13 NOTE — Assessment & Plan Note (Signed)
He has declined going back to the kidney doctors so we will continue to monitor his levels.

## 2022-05-13 NOTE — Assessment & Plan Note (Signed)
Due to recheck thyroid function.

## 2022-05-14 LAB — LIPID PANEL W/REFLEX DIRECT LDL
Cholesterol: 179 mg/dL (ref <200)
HDL: 39 mg/dL — ABNORMAL LOW (ref >=40)
LDL Cholesterol (Calc): 100 mg/dL (calc) — ABNORMAL HIGH
Non-HDL Cholesterol (Calc): 140 mg/dL (calc) — ABNORMAL HIGH (ref <130)
Total CHOL/HDL Ratio: 4.6 (calc) (ref <5.0)
Triglycerides: 278 mg/dL — ABNORMAL HIGH (ref <150)

## 2022-05-14 LAB — BASIC METABOLIC PANEL WITH GFR
BUN/Creatinine Ratio: 19 (calc) (ref 6–22)
BUN: 30 mg/dL — ABNORMAL HIGH (ref 7–25)
CO2: 22 mmol/L (ref 20–32)
Calcium: 9.8 mg/dL (ref 8.6–10.3)
Chloride: 107 mmol/L (ref 98–110)
Creat: 1.57 mg/dL — ABNORMAL HIGH (ref 0.70–1.35)
Glucose, Bld: 135 mg/dL — ABNORMAL HIGH (ref 65–99)
Potassium: 4.7 mmol/L (ref 3.5–5.3)
Sodium: 139 mmol/L (ref 135–146)
eGFR: 48 mL/min/{1.73_m2} — ABNORMAL LOW (ref >=60)

## 2022-05-14 LAB — VITAMIN D 25 HYDROXY (VIT D DEFICIENCY, FRACTURES): Vit D, 25-Hydroxy: 113 ng/mL — ABNORMAL HIGH (ref 30–100)

## 2022-05-14 LAB — MICROALBUMIN / CREATININE URINE RATIO
Creatinine, Urine: 106 mg/dL (ref 20–320)
Microalb Creat Ratio: 901 mcg/mg creat — ABNORMAL HIGH (ref <30)
Microalb, Ur: 95.5 mg/dL

## 2022-05-14 LAB — TSH: TSH: 1.66 mIU/L (ref 0.40–4.50)

## 2022-05-14 NOTE — Progress Notes (Signed)
Hi Christopher Gregory, your protein levels in the urine are still high but it does look better than it did 6 months ago.  Serum creatinine is better.  It was 1.74 months ago it is back down to 1.5 which I think is closer to your baseline which is good.  Potassium looks okay.  Triglycerides are elevated similar to prior.  Vitamin D is okay.  A little on the high end but okay.  Thyroid is normal.

## 2022-07-29 ENCOUNTER — Ambulatory Visit (INDEPENDENT_AMBULATORY_CARE_PROVIDER_SITE_OTHER): Payer: Medicare HMO | Admitting: Pharmacist

## 2022-07-29 DIAGNOSIS — I10 Essential (primary) hypertension: Secondary | ICD-10-CM

## 2022-07-29 DIAGNOSIS — E782 Mixed hyperlipidemia: Secondary | ICD-10-CM

## 2022-07-29 DIAGNOSIS — R7301 Impaired fasting glucose: Secondary | ICD-10-CM

## 2022-07-29 DIAGNOSIS — N1831 Chronic kidney disease, stage 3a: Secondary | ICD-10-CM

## 2022-07-29 NOTE — Progress Notes (Signed)
Chronic Care Management Pharmacy Note  07/29/2022 Name:  Christopher Gregory MRN:  [id number] DOB:  [birth year]  Summary: addressed HTN, HLD, and impaired fasting glucose. Patient completed his eliquis (for treating DVT). He has also started mother of apple cider vinegar + lemon, July  2023.  At last PCP visit, was advised to decrease spironolactone dose to half tablet. Patient has stopped spironolactone altogether and notes his BP readings are systolic [id number].  He is intolerant to ACEI, patient cannot recall the reaction as it was 70yr ago. He suspected it was due to allergies, his provider at the time "started zyrtec and flonase and was told not to use ACEI"   Recommendations/Changes made from today's visit: none - Continue current regimen. Recommended patient investigate his history with ACEI, as low-dose lisinopril may be beneficial, renally protective if benefits outweigh risk of old/unknown intolerance. - Counseled patient on non-pharmacologic management of elevated cholesterol and triglycerides.  Plan: f/u with pharmacist in 4-6 months  Subjective: Christopher Gregory is an 66 y.o. year old male who is a primary patient of Metheney, Rene Kocher, MD.  The CCM team was consulted for assistance with disease management and care coordination needs.    Engaged with patient by telephone for follow up visit in response to provider referral for pharmacy case management and/or care coordination services.   Consent to Services:  The patient was given information about Chronic Care Management services, agreed to services, and gave verbal consent prior to initiation of services.  Please see initial visit note for detailed documentation.   Patient Care Team: Hali Marry, MD as PCP - General (Family Medicine) Dr. Rolley Sims (Ophthalmology) Adegoroye, Wynona Luna, MD as Referring Physician (Specialist) Darius Bump, [facility] as Pharmacist (Pharmacist)  Recent office visits:  05/13/21 Beatrice Lecher  MD PCP- pt was seen for his annual wellness exam. Labs were ordered and provider discontinued Benzonatate, Guaifenesin Codeine, and Netarsudil Latanoprost. Follow up in 6 months.   Recent consult visits:  N/A   Hospital visits:  None in previous 6 months  Objective:  Lab Results  Component Value Date   CREATININE 1.57 (H) 05/13/2022   CREATININE 1.73 (H) 12/19/2021   CREATININE 1.65 (H) 11/21/2021    Lab Results  Component Value Date   HGBA1C 5.7 (H) 11/12/2021   Last diabetic Eye exam:  Lab Results  Component Value Date/Time   HMDIABEYEEXA No Retinopathy 03/27/2021 12:00 AM    Last diabetic Foot exam: No results found for: "HMDIABFOOTEX"      Component Value Date/Time   CHOL 179 05/13/2022 0931   TRIG 278 (H) 05/13/2022 0931   HDL 39 (L) 05/13/2022 0931   CHOLHDL 4.6 05/13/2022 0931   VLDL 28 11/25/2015 0925   LDLCALC 100 (H) 05/13/2022 0931       Latest Ref Rng & Units 12/19/2021   12:00 AM 11/21/2021   12:00 AM 11/12/2021   12:00 AM  Hepatic Function  Total Protein 6.1 - 8.1 g/dL 6.5  6.2  7.2   AST 10 - 35 U/L 14  27  19    ALT 9 - 46 U/L 14  16  18    Total Bilirubin 0.2 - 1.2 mg/dL 1.2  1.8  1.3     Lab Results  Component Value Date/Time   TSH 1.66 05/13/2022 09:31 AM   TSH 2.64 11/12/2021 12:00 AM       Latest Ref Rng & Units 12/19/2021   12:00 AM 05/13/2021   12:00 AM 11/12/2020  3:05 PM  CBC  WBC 3.8 - 10.8 Thousand/uL 10.1  7.6  10.5   Hemoglobin 13.2 - 17.1 g/dL 17.8  16.9  16.2   Hematocrit 38.5 - 50.0 % 53.4  51.3  47.6   Platelets 140 - 400 Thousand/uL 284  256  283     Lab Results  Component Value Date/Time   VD25OH 113 (H) 05/13/2022 09:31 AM   VD25OH 127 (H) 05/13/2021 12:00 AM    Clinical ASCVD: No  The 10-year ASCVD risk score (Arnett DK, et al., 2019) is: 26.1%   Values used to calculate the score:     Age: 66 years     Sex: Male     Is Non-Hispanic African American: No     Diabetic: No     Tobacco smoker: No     Systolic  Blood Pressure: 155 mmHg     Is BP treated: Yes     HDL Cholesterol: 39 mg/dL     Total Cholesterol: 179 mg/dL    Other: (CHADS2VASc if Afib, PHQ9 if depression, MMRC or CAT for COPD, ACT, DEXA)  Social History   Tobacco Use  Smoking Status Former   Types: Cigarettes   Quit date: 11/26/1973   Years since quitting: 48.7  Smokeless Tobacco Never   BP Readings from Last 3 Encounters:  05/13/22 (!) 155/73  12/19/21 (!) 155/92  11/12/21 128/66   Pulse Readings from Last 3 Encounters:  05/13/22 (!) 48  12/19/21 69  11/12/21 (!) 50   Wt Readings from Last 3 Encounters:  05/13/22 162 lb (73.5 kg)  12/19/21 162 lb (73.5 kg)  11/12/21 161 lb (73 kg)    Assessment: Review of patient past medical history, allergies, medications, health status, including review of consultants reports, laboratory and other test data, was performed as part of comprehensive evaluation and provision of chronic care management services.   SDOH:  (Social Determinants of Health) assessments and interventions performed:  SDOH Interventions    Flowsheet Row Office Visit from 11/26/2021 in [place] from 08/05/2020 in [place] Interventions Intervention Not Indicated Intervention Not Indicated  Housing Interventions Intervention Not Indicated Intervention Not Indicated  Transportation Interventions Intervention Not Indicated Intervention Not Indicated  Financial Strain Interventions Intervention Not Indicated Intervention Not Indicated  Physical Activity Interventions Intervention Not Indicated Intervention Not Indicated  Stress Interventions Intervention Not Indicated Intervention Not Indicated  Social Connections Interventions Intervention Not Indicated Intervention Not Indicated       CCM Care Plan  Allergies  Allergen Reactions   Neomycin     Hives   Ace Inhibitors  Other (See Comments)    Allergic reaction   Amlodipine     Swelling on 10mg  dose   Prednisone Other (See Comments)    Urgent BP and inc blood glucose   Erythromycin Rash    Medications Reviewed Today     Reviewed by Hali Marry, MD (Physician) on 05/13/22 at [place] List Status: <None>   Medication Order Taking? Sig Documenting Provider Last Dose Status Informant  allopurinol (ZYLOPRIM) 100 MG tablet [id number] Yes Take 1 tablet (100 mg total) by mouth daily. Hali Marry, MD Taking Active   amLODipine (NORVASC) 5 MG tablet [id number] Yes Take 1 tablet (5 mg total) by mouth daily. Hali Marry, MD Taking Active   apixaban [facility]) 5 MG TABS tablet [id number] Yes Take 1  tablet (5 mg total) by mouth 2 (two) times daily. 2 tabs p.o. twice daily for 7 days then 1 tab p.o. twice daily Hali Marry, MD Taking Consider Medication Status and Discontinue (No longer needed (for PRN medications))   atenolol (TENORMIN) 100 MG tablet [id number] Yes Take 1 tablet (100 mg total) by mouth daily. Hali Marry, MD Taking Active   Azelastine HCl 0.15 % SOLN [id number] Yes INHALE 1 SPRAY INTO BOTH NOSTRILS TWICE A DAY Hali Marry, MD Taking Active   Black Pepper-Turmeric (TURMERIC COMPLEX/BLACK PEPPER PO) [id number] Yes Take 800 mg by mouth 2 (two) times daily. [provider] Taking Active   cetirizine (ZYRTEC) 10 MG tablet [id number] Yes Take 10 mg by mouth daily. [provider] Taking Active   Cholecalciferol (VITAMIN D PO) [id number] Yes Take 30,000 Int'l Units by mouth 2 (two) times a week. Monday,Thursday [provider] Taking Active Self  Coenzyme Q10-Red Yeast Rice (CO Q-10 PLUS RED YEAST RICE PO) [id number] Yes Take 2 capsules by mouth daily. [provider] Taking Active   colchicine 0.6 MG tablet [id number] Yes Take 1 tablet (0.6 mg total) by mouth daily as needed. Hali Marry, MD Taking Active    diphenhydrAMINE (SIMPLY SLEEP) 25 MG tablet [id number] Yes Take 25 mg by mouth at bedtime as needed for sleep. [provider] Taking Consider Medication Status and Discontinue (No longer needed (for PRN medications))   dorzolamide-timolol (COSOPT) 22.3-6.8 MG/ML ophthalmic solution [id number] Yes Place 1 drop into both eyes 2 (two) times daily. [provider] Taking Active   furosemide (LASIX) 20 MG tablet [id number] Yes Take 1 tablet (20 mg total) by mouth daily. Hali Marry, MD Taking Active   Ginger 500 MG CAPS [id number] Yes Take by mouth in the morning and at bedtime. [provider] Taking Active   latanoprost (XALATAN) 0.005 % ophthalmic solution [id number] Yes 1 drop at bedtime. [provider] Taking Active   [place] [id number] Yes at bedtime as needed. [provider] Taking Active   levothyroxine (SYNTHROID) 50 MCG tablet [id number] Yes Take 1 tablet (50 mcg total) by mouth daily. Hali Marry, MD Taking Active   Menaquinone-7 (VITAMIN K2 PO) [id number] Yes Take 300 mg by mouth daily. [provider] Taking Active Self  Nutritional Supplements (CHRONIC KIDNEY DISEASE SUPPORT PO) [id number] Yes Take by mouth. Mothers Vinegar 2 tsp. daily [provider] Taking Active   spironolactone (ALDACTONE) 25 MG tablet [id number] Yes Take 1 tablet (25 mg total) by mouth daily. Hali Marry, MD Taking Active   traMADol Veatrice Bourbon) 50 MG tablet [id number] Yes Take 1 tablet (50 mg total) by mouth every 8 (eight) hours as needed for moderate pain. Silverio Decamp, MD Taking Active   TURMERIC PO [id number] Yes Take by mouth daily. 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   Microscopic polyangiitis ([place]) 02/10/2018   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   Combined form of 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, left eye 12/27/2017   Corneal edema of left eye 12/27/2017   Acquired deviated nasal septum 12/23/2016   Allergic rhinitis 01/03/2016   Primary open angle glaucoma (POAG) of both eyes 01/03/2016   Other long term (current) drug therapy 01/03/2016   HLD (hyperlipidemia) 01/03/2016   Elevated fasting blood sugar 01/03/2016   Calculus of kidney 01/03/2016   Left lumbar radiculopathy 04/26/2014   Essential hypertension, benign 05/26/2013   Kidney stone 05/26/2013   Avitaminosis D 11/28/2012    Immunization History  Administered Date(s) Administered   Tdap 11/09/2008, 11/30/2011   Zoster, Live 02/08/2016    Conditions to be addressed/monitored: HTN, HLD, and impaired fasting glucose  There are no care plans that you recently modified to display for this patient.   Medication Assistance: None required.  Patient affirms current coverage meets needs.  Patient's preferred pharmacy is:  [facility], [place] [address] Phone: [phone] Fax: [phone]  [id number] - [address] [place] [address] Phone: [phone] Fax: [phone]  Uses pill box? Yes Pt endorses 100% compliance  Follow Up:  Patient agrees to Care Plan and Follow-up.  Plan: Telephone follow up appointment with care management team member scheduled for:  4-6 months  Larinda Buttery, PharmD Clinical Pharmacist [facility] [phone]

## 2022-07-29 NOTE — Patient Instructions (Signed)
High Triglycerides Eating Plan ?Triglycerides are a type of fat in the blood. High levels of triglycerides can increase your risk of heart disease and stroke. If your triglyceride levels are high, choosing the right foods can help lower your triglycerides and keep your heart healthy. Work with your health care provider or a dietitian to develop an eating plan that is right for you. ?What are tips for following this plan? ?General guidelines ? ?Lose weight, if you are overweight. For most people, losing 5-10 lb (2-5 kg) helps lower triglyceride levels. A weight-loss plan may include: ?30 minutes of exercise at least 5 days a week. ?Reducing the amount of calories, sugar, and fat you eat. ?Eat a wide variety of fresh fruits, vegetables, and whole grains. These foods are high in fiber. ?Eat foods that contain healthy fats, such as fatty fish, nuts, seeds, and olive oil. ?Avoid foods that are high in added sugar, added salt (sodium), and saturated fat. ?Avoid low-fiber, refined carbohydrates such as white bread, crackers, noodles, and white rice. ?Avoid foods with trans fats or partially hydrogenated oils, such as fried foods or stick margarine. ?If you drink alcohol: ?Limit how much you have to: ?0-1 drink a day for women who are not pregnant. ?0-2 drinks a day for men. ?Your health care provider may recommend that you drink less than these amounts depending on your overall health. ?Know how much alcohol is in a drink. In the [place] one drink equals one 12 oz bottle of beer (355 mL), one 5 oz glass of wine (148 mL), or one 1? oz glass of hard liquor (44 mL). ?Reading food labels ?Check food labels for: ?The amount of saturated fat. Choose foods with no or very little saturated fat (less than 2 g). ?The amount of trans fat. Choose foods with no transfat. ?The amount of cholesterol. Choose foods that are low in cholesterol. ?The amount of sodium. Choose foods with less than 140 milligrams (mg) per serving. ?Shopping ?Buy  dairy products labeled as nonfat (skim) or low-fat (1%). ?Avoid buying processed or prepackaged foods. These are often high in added sugar, sodium, and fat. ?Cooking ?Choose healthy fats when cooking, such as olive oil, avocado oil, or canola oil. ?Cook foods using lower fat methods, such as baking, broiling, boiling, or grilling. ?Make your own sauces, dressings, and marinades when possible, instead of buying them. Store-bought sauces, dressings, and marinades are often high in sodium and sugar. ?Meal planning ?Eat more home-cooked food and less restaurant, buffet, and fast food. ?Eat fatty fish at least 2 times each week. Examples of fatty fish include salmon, trout, sardines, mackerel, tuna, and herring. ?If you eat whole eggs, do not eat more than 4 egg yolks per week. ?What foods should I eat? ?Fruits ?All fresh, canned (in natural juice), or frozen fruits. ?Vegetables ?Fresh or frozen vegetables. Low-sodium canned vegetables. ?Grains ?Whole wheat or whole grain breads, crackers, cereals, and pasta. Unsweetened oatmeal. Bulgur. Barley. Quinoa. Brown rice. Whole wheat flour tortillas. ?Meats and other proteins ?Skinless chicken or turkey. Ground chicken or turkey. Lean cuts of pork, trimmed of fat. Fish and seafood, especially salmon, trout, and herring. Egg whites. Dried beans, peas, or lentils. Unsalted nuts or seeds. Unsalted canned beans. Natural peanut or almond butter or other nut butters. ?Dairy ?Low-fat dairy products. Skim or low-fat (1%) milk. Reduced fat (2%) and low-sodium cheese. Low-fat ricotta cheese. Low-fat cottage cheese. Plain, low-fat yogurt. ?Fats and oils ?Tub margarine without trans fats. Light or reduced-fat mayonnaise. Light or   reduced-fat salad dressings. Avocado. Safflower, olive, sunflower, soybean, and canola oils. ?The items listed above may not be a complete list of recommended foods and beverages. Talk with your dietitian about what dietary choices are best for you. ?What foods  should I avoid? ?Fruits ?Sweetened dried fruit. Canned fruit in syrup. Fruit juice. ?Vegetables ?Creamed or fried vegetables. Vegetables in a cheese sauce. ?Grains ?White bread. White (regular) pasta. White rice. Cornbread. Bagels. Pastries. Crackers that contain trans fat. ?Meats and other proteins ?Fatty cuts of meat. Ribs. Chicken wings. Bacon. Sausage. Bologna. Salami. Chitterlings. Fatback. Hot dogs. Bratwurst. Packaged lunch meats. ?Dairy ?Whole or reduced-fat (2%) milk. Half-and-half. Cream cheese. Full-fat or sweetened yogurt. Full-fat cheese. Nondairy creamers. Whipped toppings. Processed cheese or cheese spreads. Cheese curds. ?Fats and oils ?Butter. Stick margarine. Lard. Shortening. Ghee. Bacon fat. Tropical oils, such as coconut, palm kernel, or palm oils. ?Beverages ?Alcohol. Sweetened drinks, such as soda, lemonade, fruit drinks, or punches. ?Sweets and desserts ?Corn syrup. Sugars. Honey. Molasses. Candy. Jam and jelly. Syrup. Sweetened cereals. Cookies. Pies. Cakes. Donuts. Muffins. Ice cream. ?Condiments ?Store-bought sauces, dressings, and marinades that are high in sugar, such as ketchup and barbecue sauce. ?The items listed above may not be a complete list of foods and beverages you should avoid. Talk with your dietitian about what dietary choices are best for you. ?Summary ?High levels of triglycerides can increase the risk of heart disease and stroke. Choosing the right foods can help lower your triglycerides. ?Eat plenty of fresh fruits, vegetables, and whole grains. Choose low-fat dairy and lean meats. Eat fatty fish at least twice a week. ?Avoid processed and prepackaged foods with added sugar, sodium, saturated fat, and trans fat. ?If you need suggestions or have questions about what types of food are good for you, talk with your health care provider or a dietitian. ?This information is not intended to replace advice given to you by your health care provider. Make sure you discuss any  questions you have with your health care provider. ?Document Revised: 03/07/2021 Document Reviewed: 03/07/2021 ?Elsevier Patient Education ? 2023 Elsevier Inc. ? ?

## 2022-07-30 NOTE — Progress Notes (Signed)
Agree with above. Would benefit greaty from ACEi esp with HTN and CKD3.    Orders Placed This Encounter  Procedures   AMB Referral to Pharmacy Medication Management    Referral Priority:   Routine    Referral Type:   Consultation    Referral Reason:   Pharmacy Medication Management    Number of Visits Requested:   1

## 2022-07-30 NOTE — Addendum Note (Signed)
Addended by: Beatrice Lecher D on: 07/30/2022 07:42 AM   Modules accepted: Orders

## 2022-08-15 ENCOUNTER — Other Ambulatory Visit: Payer: Self-pay | Admitting: Family Medicine

## 2022-08-15 DIAGNOSIS — E038 Other specified hypothyroidism: Secondary | ICD-10-CM

## 2022-08-15 DIAGNOSIS — M11272 Other chondrocalcinosis, left ankle and foot: Secondary | ICD-10-CM

## 2022-08-15 DIAGNOSIS — I10 Essential (primary) hypertension: Secondary | ICD-10-CM

## 2022-10-09 DIAGNOSIS — H01004 Unspecified blepharitis left upper eyelid: Secondary | ICD-10-CM | POA: Diagnosis not present

## 2022-10-09 DIAGNOSIS — H401123 Primary open-angle glaucoma, left eye, severe stage: Secondary | ICD-10-CM | POA: Diagnosis not present

## 2022-10-09 DIAGNOSIS — H01001 Unspecified blepharitis right upper eyelid: Secondary | ICD-10-CM | POA: Diagnosis not present

## 2022-10-09 DIAGNOSIS — H401111 Primary open-angle glaucoma, right eye, mild stage: Secondary | ICD-10-CM | POA: Diagnosis not present

## 2022-10-09 DIAGNOSIS — H01005 Unspecified blepharitis left lower eyelid: Secondary | ICD-10-CM | POA: Diagnosis not present

## 2022-10-09 DIAGNOSIS — H01002 Unspecified blepharitis right lower eyelid: Secondary | ICD-10-CM | POA: Diagnosis not present

## 2022-10-13 DIAGNOSIS — H5213 Myopia, bilateral: Secondary | ICD-10-CM | POA: Diagnosis not present

## 2022-10-13 DIAGNOSIS — H52209 Unspecified astigmatism, unspecified eye: Secondary | ICD-10-CM | POA: Diagnosis not present

## 2022-10-28 ENCOUNTER — Other Ambulatory Visit: Payer: Self-pay | Admitting: Family Medicine

## 2022-10-28 DIAGNOSIS — I10 Essential (primary) hypertension: Secondary | ICD-10-CM

## 2022-11-18 ENCOUNTER — Ambulatory Visit (INDEPENDENT_AMBULATORY_CARE_PROVIDER_SITE_OTHER): Payer: Medicare HMO | Admitting: Family Medicine

## 2022-11-18 ENCOUNTER — Encounter: Payer: Self-pay | Admitting: Family Medicine

## 2022-11-18 VITALS — BP 133/73 | HR 52 | Ht 65.0 in | Wt 161.0 lb

## 2022-11-18 DIAGNOSIS — R7301 Impaired fasting glucose: Secondary | ICD-10-CM | POA: Diagnosis not present

## 2022-11-18 DIAGNOSIS — E790 Hyperuricemia without signs of inflammatory arthritis and tophaceous disease: Secondary | ICD-10-CM | POA: Diagnosis not present

## 2022-11-18 DIAGNOSIS — N057 Unspecified nephritic syndrome with diffuse crescentic glomerulonephritis: Secondary | ICD-10-CM

## 2022-11-18 DIAGNOSIS — I10 Essential (primary) hypertension: Secondary | ICD-10-CM | POA: Diagnosis not present

## 2022-11-18 DIAGNOSIS — Z1211 Encounter for screening for malignant neoplasm of colon: Secondary | ICD-10-CM

## 2022-11-18 DIAGNOSIS — N1831 Chronic kidney disease, stage 3a: Secondary | ICD-10-CM | POA: Diagnosis not present

## 2022-11-18 LAB — POCT GLYCOSYLATED HEMOGLOBIN (HGB A1C): Hemoglobin A1C: 6.1 % — AB (ref 4.0–5.6)

## 2022-11-18 MED ORDER — SPIRONOLACTONE 25 MG PO TABS: 25.0000 mg | ORAL_TABLET | Freq: Every day | ORAL | 3 refills | Status: DC

## 2022-11-18 NOTE — Assessment & Plan Note (Addendum)
A1C up a little from previous.  Continue to work on [employer] choices he exercises regularly which is fantastic.

## 2022-11-18 NOTE — Assessment & Plan Note (Signed)
To recheck renal function. 

## 2022-11-18 NOTE — Progress Notes (Signed)
Established Patient Office Visit  Subjective   Patient ID: Christopher Gregory, male    DOB: 02-01-1956  Age: 67 y.o. MRN: [id number]  Chief Complaint  Patient presents with   Hypertension   ifg    HPI  Hypertension- Pt denies chest pain, SOB, dizziness, or heart palpitations.  Taking meds as directed w/o problems.  Denies medication side effects.  He says he did restart the spironolactone.  He had initially cut it in half and his blood pressures look great.  He contributes that to the vinegar that he takes daily.  So he stopped the medication but the blood pressures eventually started creeping back up.  So he restarted a half a tab.  Blood pressures were not coming back down so he ended up going back to a whole tab.  He reports that blood pressures at home typically run in the 120s over mid 70s and he will occasionally see a BP in the low 130s.  Impaired fasting glucose-no increased thirst or urination. No symptoms consistent with hypoglycemia.  Reports he was told to avoid all vaccines.  Especially the shingles vaccine.  Has been working out really hard at the gym and wants to know if there are certain limits or things that he really should not be doing.  And what might be working out too hard.  More recently he is actually been boxing with a bag.    ROS    Objective:     BP 133/73   Pulse (!) 52   Ht 5\' 5"  (1.651 m)   Wt 161 lb (73 kg)   SpO2 98%   BMI 26.79 kg/m    Physical Exam Constitutional:      Appearance: He is well-developed.  HENT:     Head: Normocephalic and atraumatic.  Cardiovascular:     Rate and Rhythm: Normal rate and regular rhythm.     Heart sounds: Normal heart sounds.  Pulmonary:     Effort: Pulmonary effort is normal.     Breath sounds: Normal breath sounds.  Skin:    General: Skin is warm and dry.  Neurological:     Mental Status: He is alert and oriented to person, place, and time.  Psychiatric:        Behavior: Behavior normal.       Results for orders placed or performed in visit on 11/18/22  POCT glycosylated hemoglobin (Hb A1C)  Result Value Ref Range   Hemoglobin A1C 6.1 (A) 4.0 - 5.6 %   HbA1c POC (<> result, manual entry)     HbA1c, POC (prediabetic range)     HbA1c, POC (controlled diabetic range)        The 10-year ASCVD risk score (Arnett DK, et al., 2019) is: 20.5%    Assessment & Plan:   Problem List Items Addressed This Visit       Cardiovascular and Mediastinum   Essential hypertension, benign - Primary    Well controlled. Continue current regimen. Follow up in  6 months       Relevant Medications   spironolactone (ALDACTONE) 25 MG tablet   Other Relevant Orders   COMPLETE METABOLIC PANEL WITH GFR   Urine Microalbumin w/creat. ratio   Uric acid     Endocrine   IFG (impaired fasting glucose)    A1C up a little from previous.  Continue to work on [employer] choices he exercises regularly which is fantastic.      Relevant Orders   COMPLETE METABOLIC PANEL  WITH GFR   Urine Microalbumin w/creat. ratio   Uric acid   POCT glycosylated hemoglobin (Hb A1C) (Completed)     Genitourinary   Stage 3a chronic kidney disease ([place])    To recheck renal function.      Relevant Orders   COMPLETE METABOLIC PANEL WITH GFR   Urine Microalbumin w/creat. 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      Relevant Orders   COMPLETE METABOLIC PANEL WITH GFR   Urine Microalbumin w/creat. ratio   Uric acid   Other Visit Diagnoses     Elevated uric acid in blood       Relevant Orders   Uric acid   Screening for malignant neoplasm of colon       Relevant Orders   Cologuard      Greed to do Cologuard for colon cancer screening.  Discussed vaccines and that they are not contraindicated with vasculitis so he really encouraged him to have them done.  Recommend that he could start with his Tdap and do them individually  so that if he does have a problem or concern then we can address that.  Return in about 6 months (around 05/19/2023) for renal and BP .    Beatrice Lecher, MD

## 2022-11-18 NOTE — Assessment & Plan Note (Signed)
Lan to recheck full labs at next office visit in 6 months.

## 2022-11-18 NOTE — Assessment & Plan Note (Signed)
Well controlled. Continue current regimen. Follow up in  6 months.  

## 2022-11-19 LAB — COMPLETE METABOLIC PANEL WITH GFR
AG Ratio: 1.8 (calc) (ref 1.0–2.5)
ALT: 23 U/L (ref 9–46)
AST: 21 U/L (ref 10–35)
Albumin: 4.5 g/dL (ref 3.6–5.1)
Alkaline phosphatase (APISO): 45 U/L (ref 35–144)
BUN/Creatinine Ratio: 24 (calc) — ABNORMAL HIGH (ref 6–22)
BUN: 37 mg/dL — ABNORMAL HIGH (ref 7–25)
CO2: 23 mmol/L (ref 20–32)
Calcium: 9.8 mg/dL (ref 8.6–10.3)
Chloride: 109 mmol/L (ref 98–110)
Creat: 1.56 mg/dL — ABNORMAL HIGH (ref 0.70–1.35)
Globulin: 2.5 g/dL (calc) (ref 1.9–3.7)
Glucose, Bld: 140 mg/dL — ABNORMAL HIGH (ref 65–99)
Potassium: 5.8 mmol/L — ABNORMAL HIGH (ref 3.5–5.3)
Sodium: 141 mmol/L (ref 135–146)
Total Bilirubin: 1 mg/dL (ref 0.2–1.2)
Total Protein: 7 g/dL (ref 6.1–8.1)
eGFR: 48 mL/min/{1.73_m2} — ABNORMAL LOW (ref >=60)

## 2022-11-19 LAB — MICROALBUMIN / CREATININE URINE RATIO
Creatinine, Urine: 53 mg/dL (ref 20–320)
Microalb Creat Ratio: 958 mcg/mg creat — ABNORMAL HIGH (ref <30)
Microalb, Ur: 50.8 mg/dL

## 2022-11-19 LAB — URIC ACID: Uric Acid, Serum: 6.6 mg/dL (ref 4.0–8.0)

## 2022-11-19 NOTE — Progress Notes (Signed)
[place], kidney function is stable around 1.5 this is your baseline and GFR stable in the 40s.  You are still losing some protein through the urine but it has been stable over the last year.  Uric acid is 6.6.  And just a reminder that the A1c was up from previous to just make sure you are really working on healthy foods with mostly a plant-based diet.  Please keep follow-up.  Also just a reminder that you are due for your Medicare wellness exam at the end of the month.  We are happy to get you scheduled at your convenience with our Medicare wellness nurse, Novella Rob RN

## 2022-11-25 DIAGNOSIS — Z1211 Encounter for screening for malignant neoplasm of colon: Secondary | ICD-10-CM | POA: Diagnosis not present

## 2022-12-01 ENCOUNTER — Telehealth: Payer: Medicare HMO

## 2022-12-02 ENCOUNTER — Telehealth: Payer: Medicare HMO

## 2022-12-02 LAB — COLOGUARD: COLOGUARD: NEGATIVE

## 2022-12-03 NOTE — Progress Notes (Signed)
Great news! Your Cologuard test is negative.  Recommend repeat colon cancer screening in 3 years.

## 2022-12-04 ENCOUNTER — Ambulatory Visit (INDEPENDENT_AMBULATORY_CARE_PROVIDER_SITE_OTHER): Payer: Medicare HMO | Admitting: Family Medicine

## 2022-12-04 DIAGNOSIS — Z0000 Encounter for general adult medical examination without abnormal findings: Secondary | ICD-10-CM

## 2022-12-04 NOTE — Patient Instructions (Addendum)
[facility] Maintenance Summary and Written Plan of Care  Christopher Gregory ,  Thank you for allowing me to perform your Medicare Annual Wellness Visit and for your ongoing commitment to your health.   Health Maintenance & Immunization History Health Maintenance  Topic Date Due   DTaP/Tdap/Td (3 - Td or Tdap) 11/29/2021   INFLUENZA VACCINE  02/07/2024 (Originally 06/09/2022)   Pneumonia Vaccine 5+ Years old (1 - PCV) 11/19/2027 (Originally 06/11/2019)   COVID-19 Vaccine (1) 12/05/2027 (Originally 06/11/1961)   Zoster Vaccines- Shingrix (1 of 2) 02/17/2028 (Originally 06/10/1973)   Medicare Annual Wellness (AWV)  12/05/2023   Hepatitis C Screening  Completed   HPV VACCINES  Aged Out   Fecal DNA (Cologuard)  Discontinued   Immunization History  Administered Date(s) Administered   Tdap 11/09/2008, 11/30/2011   Zoster, Live 02/08/2016    These are the patient goals that we discussed:  Goals Addressed               This Visit's Progress     Patient Stated (pt-stated)        Patient stated that he would like to continue to go to the gym 4 days a week.         This is a list of Health Maintenance Items that are overdue or due now: Health Maintenance Due  Topic Date Due   DTaP/Tdap/Td (3 - Td or Tdap) 11/29/2021   Follow-up with Hali Marry, MD as planned Medicare wellness visit in one year. Patient will access AVS on my chart.  Orders/Referrals Placed Today: No orders of the defined types were placed in this encounter.  (Contact our referral department at [phone] if you have not spoken with someone about your referral appointment within the next 5 days)    Follow-up Plan Follow-up with Hali Marry, MD as planned Medicare wellness visit in one year. Patient will access AVS on my chart.         Health Maintenance, Male Adopting a healthy lifestyle and getting preventive care are important in promoting health and  wellness. Ask your health care provider about: The right schedule for you to have regular tests and exams. Things you can do on your own to prevent diseases and keep yourself healthy. What should I know about diet, weight, and exercise? Eat a healthy diet  Eat a diet that includes plenty of vegetables, fruits, low-fat dairy products, and lean protein. Do not eat a lot of foods that are high in solid fats, added sugars, or sodium. Maintain a healthy weight Body mass index (BMI) is a measurement that can be used to identify possible weight problems. It estimates body fat based on height and weight. Your health care provider can help determine your BMI and help you achieve or maintain a healthy weight. Get regular exercise Get regular exercise. This is one of the most important things you can do for your health. Most adults should: Exercise for at least 150 minutes each week. The exercise should increase your heart rate and make you sweat (moderate-intensity exercise). Do strengthening exercises at least twice a week. This is in addition to the moderate-intensity exercise. Spend less time sitting. Even light physical activity can be beneficial. Watch cholesterol and blood lipids Have your blood tested for lipids and cholesterol at 69 years of age, then have this test every 5 years. You may need to have your cholesterol levels checked more often if: Your lipid or cholesterol levels are high. You  are older than 69 years of age. You are at high risk for heart disease. What should I know about cancer screening? Many types of cancers can be detected early and may often be prevented. Depending on your health history and family history, you may need to have cancer screening at various ages. This may include screening for: Colorectal cancer. Prostate cancer. Skin cancer. Lung cancer. What should I know about heart disease, diabetes, and high blood pressure? Blood pressure and heart disease High  blood pressure causes heart disease and increases the risk of stroke. This is more likely to develop in people who have high blood pressure readings or are overweight. Talk with your health care provider about your target blood pressure readings. Have your blood pressure checked: Every 3-5 years if you are 21-22 years of age. Every year if you are 61 years old or older. If you are between the ages of 78 and 67 and are a current or former smoker, ask your health care provider if you should have a one-time screening for abdominal aortic aneurysm (AAA). Diabetes Have regular diabetes screenings. This checks your fasting blood sugar level. Have the screening done: Once every three years after age 42 if you are at a normal weight and have a low risk for diabetes. More often and at a younger age if you are overweight or have a high risk for diabetes. What should I know about preventing infection? Hepatitis B If you have a higher risk for hepatitis B, you should be screened for this virus. Talk with your health care provider to find out if you are at risk for hepatitis B infection. Hepatitis C Blood testing is recommended for: Everyone born from [birth year] through 1967. Anyone with known risk factors for hepatitis C. Sexually transmitted infections (STIs) You should be screened each year for STIs, including gonorrhea and chlamydia, if: You are sexually active and are younger than 69 years of age. You are older than 69 years of age and your health care provider tells you that you are at risk for this type of infection. Your sexual activity has changed since you were last screened, and you are at increased risk for chlamydia or gonorrhea. Ask your health care provider if you are at risk. Ask your health care provider about whether you are at high risk for HIV. Your health care provider may recommend a prescription medicine to help prevent HIV infection. If you choose to take medicine to prevent HIV, you  should first get tested for HIV. You should then be tested every 3 months for as long as you are taking the medicine. Follow these instructions at home: Alcohol use Do not drink alcohol if your health care provider tells you not to drink. If you drink alcohol: Limit how much you have to 0-2 drinks a day. Know how much alcohol is in your drink. In the [place] one drink equals one 12 oz bottle of beer (355 mL), one 5 oz glass of wine (148 mL), or one 1 oz glass of hard liquor (44 mL). Lifestyle Do not use any products that contain nicotine or tobacco. These products include cigarettes, chewing tobacco, and vaping devices, such as e-cigarettes. If you need help quitting, ask your health care provider. Do not use street drugs. Do not share needles. Ask your health care provider for help if you need support or information about quitting drugs. General instructions Schedule regular health, dental, and eye exams. Stay current with your vaccines. Tell your health care  provider if: You often feel depressed. You have ever been abused or do not feel safe at home. Summary Adopting a healthy lifestyle and getting preventive care are important in promoting health and wellness. Follow your health care provider's instructions about healthy diet, exercising, and getting tested or screened for diseases. Follow your health care provider's instructions on monitoring your cholesterol and blood pressure. This information is not intended to replace advice given to you by your health care provider. Make sure you discuss any questions you have with your health care provider. Document Revised: 03/17/2021 Document Reviewed: 03/17/2021 Elsevier Patient Education  [place].

## 2022-12-04 NOTE — Progress Notes (Signed)
MEDICARE ANNUAL WELLNESS VISIT  12/04/2022  Telephone Visit Disclaimer This Medicare AWV was conducted by telephone due to national recommendations for restrictions regarding the COVID-19 Pandemic (e.g. social distancing).  I verified, using two identifiers, that I am speaking with [name] or their authorized healthcare agent. I discussed the limitations, risks, security, and privacy concerns of performing an evaluation and management service by telephone and the potential availability of an in-person appointment in the future. The patient expressed understanding and agreed to proceed.  Location of Patient: Home Location of Provider (nurse):  In the office.  Subjective:    Christopher Gregory is a 67 y.o. male patient of Metheney, Barbarann Ehlers, MD who had a Medicare Annual Wellness Visit today via telephone. Christopher Gregory is Retired and lives with their spouse and two great granddaughters. he has 2 children. he reports that he is socially active and does interact with friends/family regularly. he is moderately physically active and enjoys playing racquetball and going to gym four days a week.  Patient Care Team: Agapito Games, MD as PCP - General (Family Medicine) Dr. Ellis Parents (Ophthalmology) Adegoroye, Charlies Silvers, MD as Referring Physician (Specialist) Gabriel Carina, [facility] as Pharmacist (Pharmacist)     12/04/2022    2:30 PM 11/26/2021   11:14 AM 05/13/2021    9:22 AM 08/05/2020    3:47 PM 11/26/2014    8:39 AM 05/01/2014    8:38 AM  Advanced Directives  Does Patient Have a Medical Advance Directive? No No Yes No No Patient does not have advance directive;Patient would like information  Type of Advance Directive   Living will;Healthcare Power of Attorney     Copy of Healthcare Power of Attorney in Chart?   No - copy requested     Would patient like information on creating a medical advance directive? No - Patient declined No - Patient declined  Yes (MAU/Ambulatory/Procedural Areas -  Information given) Yes - Educational materials given     Hospital Utilization Over the Past 12 Months: # of hospitalizations or ER visits: 0 # of surgeries: 0  Review of Systems    Patient reports that his overall health is better compared to last year.  History obtained from chart review and the patient  Patient Reported Readings (BP, Pulse, CBG, Weight, etc) none  Pain Assessment Pain : No/denies pain     Current Medications & Allergies (verified) Allergies as of 12/04/2022       Reactions   Neomycin    Hives   Ace Inhibitors Other (See Comments)   Allergic reaction   Amlodipine    Swelling on 10mg  dose   Prednisone Other (See Comments)   Urgent BP and inc blood glucose   Erythromycin Rash        Medication List        Accurate as of December 04, 2022  2:47 PM. If you have any questions, ask your nurse or doctor.          allopurinol 100 MG tablet Commonly known as: ZYLOPRIM TAKE 1 TABLET (100 MG TOTAL) BY MOUTH DAILY.   AMBULATORY NON FORMULARY MEDICATION Take 1 tablet by mouth at bedtime as needed. Medication Name: equate Sleep aid   amLODipine 5 MG tablet Commonly known as: NORVASC TAKE 1 TABLET (5 MG TOTAL) BY MOUTH DAILY.   APPLE CIDER VINEGAR PO Take 1 each by mouth daily.   atenolol 100 MG tablet Commonly known as: TENORMIN TAKE 1 TABLET (100 MG TOTAL) BY MOUTH DAILY.  Azelastine HCl 0.15 % Soln INHALE 1 SPRAY INTO BOTH NOSTRILS TWICE A DAY   cetirizine 10 MG tablet Commonly known as: ZYRTEC Take 10 mg by mouth daily.   CHRONIC KIDNEY DISEASE SUPPORT PO Take by mouth. Mothers Vinegar 2 tsp. daily   CO Q-10 PLUS RED YEAST RICE PO Take 2 capsules by mouth daily.   colchicine 0.6 MG tablet Take 1 tablet (0.6 mg total) by mouth daily as needed.   dorzolamide-timolol 2-0.5 % ophthalmic solution Commonly known as: COSOPT Place 1 drop into both eyes 2 (two) times daily.   furosemide 20 MG tablet Commonly known as: LASIX TAKE 1  TABLET (20 MG TOTAL) BY MOUTH DAILY.   Ginger 500 MG Caps Take by mouth in the morning and at bedtime.   latanoprost 0.005 % ophthalmic solution Commonly known as: XALATAN 1 drop at bedtime.   Lavender Oil Oil at bedtime as needed.   levothyroxine 50 MCG tablet Commonly known as: SYNTHROID TAKE 1 TABLET (50 MCG TOTAL) BY MOUTH DAILY.   spironolactone 25 MG tablet Commonly known as: ALDACTONE Take 1 tablet (25 mg total) by mouth daily.   TURMERIC COMPLEX/BLACK PEPPER PO Take 800 mg by mouth 2 (two) times daily.   TURMERIC PO Take by mouth daily.   VITAMIN D PO Take 30,000 Int'l Units by mouth 2 (two) times a week. Monday,Thursday   VITAMIN K2 PO Take 300 mg by mouth daily.        History (reviewed): Past Medical History:  Diagnosis Date   Allergy    Gout    Hypertension    Thyroid disease    Past Surgical History:  Procedure Laterality Date   KNEE ARTHROSCOPY     TOTAL HIP ARTHROPLASTY  2000   TOTAL HIP ARTHROPLASTY  2004   Family History  Problem Relation Age of Onset   Hypertension Mother    Hyperlipidemia Father    Social History   Socioeconomic History   Marital status: Married    Spouse name: Dois Davenport   Number of children: 2   Years of education: 19   Highest education level: Tax adviser degree: occupational, Scientist, product/process development, or vocational program  Occupational History   Occupation: ATM Tech    Comment: [employer] Truck Air traffic controller: [employer]   Occupation: Retired  Tobacco Use   Smoking status: Former    Types: Cigarettes    Quit date: 11/26/1973    Years since quitting: 49.0   Smokeless tobacco: Never  Vaping Use   Vaping Use: Never used  Substance and Sexual Activity   Alcohol use: No   Drug use: No   Sexual activity: Yes    Partners: Female  Other Topics Concern   Not on file  Social History Narrative   Lives with his wife and has court custody of great grand daughters. Enjoys playing racquetball and going to the gym four times a week.    Social Determinants of Health   Financial Resource Strain: Low Risk  (12/04/2022)   Overall Financial Resource Strain (CARDIA)    Difficulty of Paying Living Expenses: Not hard at all  Food Insecurity: No Food Insecurity (12/04/2022)   Hunger Vital Sign    Worried About Running Out of Food in the Last Year: Never true    Ran Out of Food in the Last Year: Never true  Transportation Needs: No Transportation Needs (12/04/2022)   PRAPARE - Administrator, Civil Service (Medical): No    Lack of Transportation (Non-Medical):  No  Physical Activity: Sufficiently Active (12/04/2022)   Exercise Vital Sign    Days of Exercise per Week: 4 days    Minutes of Exercise per Session: 150+ min  Stress: No Stress Concern Present (12/04/2022)   [employer] of Occupational Health - Occupational Stress Questionnaire    Feeling of Stress : Not at all  Social Connections: Socially Integrated (12/04/2022)   Social Connection and Isolation Panel [NHANES]    Frequency of Communication with Friends and Family: Not on file    Frequency of Social Gatherings with Friends and Family: More than three times a week    Attends Religious Services: More than 4 times per year    Active Member of [employer] or Organizations: Yes    Attends Banker Meetings: More than 4 times per year    Marital Status: Married    Activities of Daily Living    12/04/2022    2:38 PM  In your present state of health, do you have any difficulty performing the following activities:  Hearing? 0  Vision? 0  Difficulty concentrating or making decisions? 0  Walking or climbing stairs? 0  Dressing or bathing? 0  Doing errands, shopping? 0  Preparing Food and eating ? N  Using the Toilet? N  In the past six months, have you accidently leaked urine? N  Do you have problems with loss of bowel control? N  Managing your Medications? N  Managing your Finances? N  Housekeeping or managing your Housekeeping? N     Patient Education/ Literacy How often do you need to have someone help you when you read instructions, pamphlets, or other written materials from your doctor or pharmacy?: 1 - Never What is the last grade level you completed in school?: 12th grade  Exercise Current Exercise Habits: Home exercise routine, Type of exercise: strength training/weights, Time (Minutes): > 60, Frequency (Times/Week): 4, Weekly Exercise (Minutes/Week): 0, Intensity: Moderate, Exercise limited by: None identified  Diet Patient reports consuming 2 meals a day and 0 snack(s) a day Patient reports that his primary diet is: Regular Patient reports that she does have regular access to food.   Depression Screen    12/04/2022    2:30 PM 11/18/2022    9:28 AM 05/13/2022    9:00 AM 12/19/2021    3:25 PM 11/26/2021   11:17 AM 11/12/2021    9:10 AM 05/13/2021    9:22 AM  PHQ 2/9 Scores  PHQ - 2 Score 0 0 0 0 0 0 0     Fall Risk    12/04/2022    2:30 PM 11/18/2022    9:28 AM 05/13/2022    8:58 AM 12/19/2021    3:25 PM 11/26/2021   11:16 AM  Fall Risk   Falls in the past year? 0 0 0 0 1  Number falls in past yr: 0 0 0 0 0  Injury with Fall? 0 0 0 0 0  Risk for fall due to : No Fall Risks No Fall Risks No Fall Risks No Fall Risks No Fall Risks  Follow up Falls evaluation completed;Education provided;Falls prevention discussed Falls evaluation completed Falls prevention discussed;Falls evaluation completed Falls prevention discussed;Falls evaluation completed Falls evaluation completed     Objective:  Christopher Gregory seemed alert and oriented and he participated appropriately during our telephone visit.  Blood Pressure Weight BMI  BP Readings from Last 3 Encounters:  11/18/22 133/73  05/13/22 (!) 155/73  12/19/21 (!) 155/92   Wt Readings from  Last 3 Encounters:  11/18/22 161 lb (73 kg)  05/13/22 162 lb (73.5 kg)  12/19/21 162 lb (73.5 kg)   BMI Readings from Last 1 Encounters:  11/18/22 26.79 kg/m    *Unable to  obtain current vital signs, weight, and BMI due to telephone visit type  Hearing/Vision  Christopher Gregory did not seem to have difficulty with hearing/understanding during the telephone conversation Reports that he has had a formal eye exam by an eye care professional within the past year Reports that he has not had a formal hearing evaluation within the past year *Unable to fully assess hearing and vision during telephone visit type  Cognitive Function:    12/04/2022    2:40 PM 11/26/2021   11:25 AM 08/05/2020    3:50 PM 07/11/2019    3:17 PM  6CIT Screen  What Year? 0 points 0 points 0 points 0 points  What month? 0 points 0 points 0 points 0 points  What time? 0 points 0 points 0 points 0 points  Count back from 20 0 points 0 points 0 points 0 points  Months in reverse 0 points 0 points 0 points 0 points  Repeat phrase 0 points 0 points 0 points 0 points  Total Score 0 points 0 points 0 points 0 points   (Normal:0-7, Significant for Dysfunction: >8)  Normal Cognitive Function Screening: Yes   Immunization & Health Maintenance Record Immunization History  Administered Date(s) Administered   Tdap 11/09/2008, 11/30/2011   Zoster, Live 02/08/2016    Health Maintenance  Topic Date Due   DTaP/Tdap/Td (3 - Td or Tdap) 11/29/2021   INFLUENZA VACCINE  02/07/2024 (Originally 06/09/2022)   Pneumonia Vaccine 55+ Years old (1 - PCV) 11/19/2027 (Originally 06/11/2019)   COVID-19 Vaccine (1) 12/05/2027 (Originally 06/11/1961)   Zoster Vaccines- Shingrix (1 of 2) 02/17/2028 (Originally 06/10/1973)   Medicare Annual Wellness (AWV)  12/05/2023   Hepatitis C Screening  Completed   HPV VACCINES  Aged Out   Fecal DNA (Cologuard)  Discontinued       Assessment  This is a routine wellness examination for Christopher Gregory.  Health Maintenance: Due or Overdue Health Maintenance Due  Topic Date Due   DTaP/Tdap/Td (3 - Td or Tdap) 11/29/2021    Christopher Gregory does not need a referral for Community  Assistance: Care Management:   no Social Work:    no Prescription Assistance:  no Nutrition/Diabetes Education:  no   Plan:  Personalized Goals  Goals Addressed               This Visit's Progress     Patient Stated (pt-stated)        Patient stated that he would like to continue to go to the gym 4 days a week.       Personalized Health Maintenance & Screening Recommendations  Pneumococcal vaccine  Influenza vaccine Td vaccine Shingrix vaccine  Patient declined vaccines at this time.   Lung Cancer Screening Recommended: no (Low Dose CT Chest recommended if Age 64-80 years, 30 pack-year currently smoking OR have quit w/in past 15 years) Hepatitis C Screening recommended: no HIV Screening recommended: no  Advanced Directives: Written information was not prepared per patient's request.  Referrals & Orders No orders of the defined types were placed in this encounter.   Follow-up Plan Follow-up with Hali Marry, MD as planned Medicare wellness visit in one year. Patient will access AVS on my chart.   I have personally reviewed and noted the following  in the patient's chart:   Medical and social history Use of alcohol, tobacco or illicit drugs  Current medications and supplements Functional ability and status Nutritional status Physical activity Advanced directives List of other physicians Hospitalizations, surgeries, and ER visits in previous 12 months Vitals Screenings to include cognitive, depression, and falls Referrals and appointments  In addition, I have reviewed and discussed with Christopher Gregory certain preventive protocols, quality metrics, and best practice recommendations. A written personalized care plan for preventive services as well as general preventive health recommendations is available and can be mailed to the patient at his request.      Tinnie Gens, RN BSN  12/04/2022

## 2022-12-09 ENCOUNTER — Ambulatory Visit (INDEPENDENT_AMBULATORY_CARE_PROVIDER_SITE_OTHER): Payer: Medicare HMO | Admitting: Pharmacist

## 2022-12-09 DIAGNOSIS — I10 Essential (primary) hypertension: Secondary | ICD-10-CM

## 2022-12-09 DIAGNOSIS — E782 Mixed hyperlipidemia: Secondary | ICD-10-CM

## 2022-12-09 DIAGNOSIS — R7301 Impaired fasting glucose: Secondary | ICD-10-CM

## 2022-12-09 NOTE — Progress Notes (Signed)
Chronic Care Management Pharmacy Note  12/09/2022 Name:  Christopher Gregory MRN:  [id number] DOB:  08-Jan-1956  Summary: addressed HTN, HLD, and impaired fasting glucose. Patient had a slight elevation in A1c, he reports this was from holiday foods and should be coming back down.  BP readings at home: 130s/70s.  Patient does 4 days per week at gym with the following regimen:  5 x 3 minute rounds on boxing bag + racquetball each morning, + 2 machines for upper strength.  Recommendations/Changes made from today's visit: none, patient is doing well.  Plan: f/u with pharmacist as needed  Subjective: Christopher Gregory is an 67 y.o. year old male who is a primary patient of Metheney, Christopher Kocher, MD.  The CCM team was consulted for assistance with disease management and care coordination needs.    Engaged with patient by telephone for follow up visit in response to provider referral for pharmacy case management and/or care coordination services.   Consent to Services:  The patient was given information about Chronic Care Management services, agreed to services, and gave verbal consent prior to initiation of services.  Please see initial visit note for detailed documentation.   Patient Care Team: Christopher Marry, MD as PCP - General (Family Medicine) Dr. Rolley Gregory (Ophthalmology) Adegoroye, Christopher Luna, MD as Referring Physician (Specialist) Darius Bump, [facility] as Pharmacist (Pharmacist)  Recent office visits:  05/13/21 Christopher Lecher MD PCP- pt was seen for his annual wellness exam. Labs were ordered and provider discontinued Benzonatate, Guaifenesin Codeine, and Netarsudil Latanoprost. Follow up in 6 months.   Recent consult visits:  N/A   Hospital visits:  None in previous 6 months  Objective:  Lab Results  Component Value Date   CREATININE 1.56 (H) 11/18/2022   CREATININE 1.57 (H) 05/13/2022   CREATININE 1.73 (H) 12/19/2021    Lab Results  Component Value Date   HGBA1C 6.1  (A) 11/18/2022   Last diabetic Eye exam:  Lab Results  Component Value Date/Time   HMDIABEYEEXA No Retinopathy 03/27/2021 12:00 AM    Last diabetic Foot exam: No results found for: "HMDIABFOOTEX"      Component Value Date/Time   CHOL 179 05/13/2022 0931   TRIG 278 (H) 05/13/2022 0931   HDL 39 (L) 05/13/2022 0931   CHOLHDL 4.6 05/13/2022 0931   VLDL 28 11/25/2015 0925   LDLCALC 100 (H) 05/13/2022 0931       Latest Ref Rng & Units 11/18/2022   10:14 AM 12/19/2021   12:00 AM 11/21/2021   12:00 AM  Hepatic Function  Total Protein 6.1 - 8.1 g/dL 7.0  6.5  6.2   AST 10 - 35 U/L 21  14  27    ALT 9 - 46 U/L 23  14  16    Total Bilirubin 0.2 - 1.2 mg/dL 1.0  1.2  1.8     Lab Results  Component Value Date/Time   TSH 1.66 05/13/2022 09:31 AM   TSH 2.64 11/12/2021 12:00 AM       Latest Ref Rng & Units 12/19/2021   12:00 AM 05/13/2021   12:00 AM 11/12/2020    3:05 PM  CBC  WBC 3.8 - 10.8 Thousand/uL 10.1  7.6  10.5   Hemoglobin 13.2 - 17.1 g/dL 17.8  16.9  16.2   Hematocrit 38.5 - 50.0 % 53.4  51.3  47.6   Platelets 140 - 400 Thousand/uL 284  256  283     Lab Results  Component Value Date/Time  VD25OH 113 (H) 05/13/2022 09:31 AM   VD25OH 127 (H) 05/13/2021 12:00 AM    Clinical ASCVD: No  The 10-year ASCVD risk score (Arnett DK, et al., 2019) is: 20.5%   Values used to calculate the score:     Age: 67 years     Sex: Male     Is Non-Hispanic African American: No     Diabetic: No     Tobacco smoker: No     Systolic Blood Pressure: 133 mmHg     Is BP treated: Yes     HDL Cholesterol: 39 mg/dL     Total Cholesterol: 179 mg/dL    Other: ([id number] if Afib, PHQ9 if depression, MMRC or CAT for COPD, ACT, DEXA)  Social History   Tobacco Use  Smoking Status Former   Types: Cigarettes   Quit date: 11/26/1973   Years since quitting: 49.0  Smokeless Tobacco Never   BP Readings from Last 3 Encounters:  11/18/22 133/73  05/13/22 (!) 155/73  12/19/21 (!) 155/92   Pulse  Readings from Last 3 Encounters:  11/18/22 (!) 52  05/13/22 (!) 48  12/19/21 69   Wt Readings from Last 3 Encounters:  11/18/22 161 lb (73 kg)  05/13/22 162 lb (73.5 kg)  12/19/21 162 lb (73.5 kg)    Assessment: Review of patient past medical history, allergies, medications, health status, including review of consultants reports, laboratory and other test data, was performed as part of comprehensive evaluation and provision of chronic care management services.   SDOH:  (Social Determinants of Health) assessments and interventions performed:  SDOH Interventions    Flowsheet Row Office Visit from 11/26/2021 in [facility] Primary [facility] Medicine at [facility] Clinical Support from 08/05/2020 in [facility] Primary Care & Sports Medicine at [facility]  SDOH Interventions    Food Insecurity Interventions Intervention Not Indicated Intervention Not Indicated  Housing Interventions Intervention Not Indicated Intervention Not Indicated  Transportation Interventions Intervention Not Indicated Intervention Not Indicated  Financial Strain Interventions Intervention Not Indicated Intervention Not Indicated  Physical Activity Interventions Intervention Not Indicated Intervention Not Indicated  Stress Interventions Intervention Not Indicated Intervention Not Indicated  Social Connections Interventions Intervention Not Indicated Intervention Not Indicated       CCM Care Plan  Allergies  Allergen Reactions   Neomycin     Hives   Ace Inhibitors Other (See Comments)    Allergic reaction   Amlodipine     Swelling on 10mg  dose   Prednisone Other (See Comments)    Urgent BP and inc blood glucose   Erythromycin Rash    Medications Reviewed Today     Reviewed by , RN (Registered Nurse) on 12/04/22 at 1429  Med List Status: <None>   Medication Order Taking? Sig Documenting Provider Last Dose Status Informant  allopurinol (ZYLOPRIM) 100 MG tablet  12/06/22 Yes TAKE 1 TABLET (100 MG TOTAL) BY MOUTH DAILY. [id number], MD Taking Active   AMBULATORY Agapito Games MEDICATION Clent Demark Yes Take 1 tablet by mouth at bedtime as needed. Medication Name: equate Sleep aid [provider] Taking Active   amLODipine (NORVASC) 5 MG tablet [id number] Yes TAKE 1 TABLET (5 MG TOTAL) BY MOUTH DAILY. [id number], MD Taking Active   APPLE CIDER VINEGAR PO Agapito Games Yes Take 1 each by mouth daily. [provider] Taking Active   atenolol (TENORMIN) 100 MG tablet [id number] Yes TAKE 1 TABLET (100 MG TOTAL) BY MOUTH DAILY. [id number], MD Taking  Active   Azelastine HCl 0.15 % SOLN [id number] Yes INHALE 1 SPRAY INTO BOTH NOSTRILS TWICE A DAY Christopher Marry, MD Taking Active   Black Pepper-Turmeric (TURMERIC COMPLEX/BLACK PEPPER PO) [id number] Yes Take 800 mg by mouth 2 (two) times daily. [provider] Taking Active   cetirizine (ZYRTEC) 10 MG tablet [id number] Yes Take 10 mg by mouth daily. [provider] Taking Active   Cholecalciferol (VITAMIN D PO) [id number] Yes Take 30,000 Int'l Units by mouth 2 (two) times a week. Monday,Thursday [provider] Taking Active Self  Coenzyme Q10-Red Yeast Rice (CO Q-10 PLUS RED YEAST RICE PO) [id number] Yes Take 2 capsules by mouth daily. [provider] Taking Active   colchicine 0.6 MG tablet [id number] Yes Take 1 tablet (0.6 mg total) by mouth daily as needed. Christopher Marry, MD Taking Active   dorzolamide-timolol (COSOPT) 22.3-6.8 MG/ML ophthalmic solution [id number] Yes Place 1 drop into both eyes 2 (two) times daily. [provider] Taking Active   furosemide (LASIX) 20 MG tablet [id number] Yes TAKE 1 TABLET (20 MG TOTAL) BY MOUTH DAILY. Christopher Marry, MD Taking Active   Ginger 500 MG CAPS [id number] Yes Take by mouth in the morning and at bedtime. [provider] Taking Active   latanoprost (XALATAN) 0.005 %  ophthalmic solution [id number] Yes 1 drop at bedtime. [provider] Taking Active   [place] [id number] Yes at bedtime as needed. [provider] Taking Active   levothyroxine (SYNTHROID) 50 MCG tablet [id number] Yes TAKE 1 TABLET (50 MCG TOTAL) BY MOUTH DAILY. Christopher Marry, MD Taking Active   Menaquinone-7 (VITAMIN K2 PO) [id number] Yes Take 300 mg by mouth daily. [provider] Taking Active Self  Nutritional Supplements (CHRONIC KIDNEY DISEASE SUPPORT PO) [id number] Yes Take by mouth. Mothers Vinegar 2 tsp. daily [provider] Taking Active   spironolactone (ALDACTONE) 25 MG tablet [id number] Yes Take 1 tablet (25 mg total) by mouth daily. Christopher Marry, MD Taking Active   TURMERIC PO [id number] Yes Take by mouth daily. 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   Microscopic polyangiitis ([place]) 02/10/2018   Primary open-angle glaucoma 12/27/2017   Combined form of 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, left eye 12/27/2017   Corneal edema of left eye 12/27/2017   Acquired deviated nasal septum 12/23/2016   Allergic rhinitis 01/03/2016   Primary open angle glaucoma (POAG) of both eyes 01/03/2016   Other long term (current) drug therapy 01/03/2016   HLD (hyperlipidemia) 01/03/2016   Calculus of kidney 01/03/2016   Left lumbar radiculopathy 04/26/2014   Essential hypertension, benign 05/26/2013   Kidney  stone 05/26/2013   Avitaminosis D 11/28/2012    Immunization History  Administered Date(s) Administered   Tdap 11/09/2008, 11/30/2011   Zoster, Live 02/08/2016    Conditions to be addressed/monitored: HTN, HLD, and impaired fasting glucose  There are no care plans that you recently modified to display for this  patient.   Medication Assistance: None required.  Patient affirms current coverage meets needs.  Patient's preferred pharmacy is:  [facility], [place] [address] Phone: [phone] Fax: [phone]  [id number] - [address], [place] [address] Phone: [phone] Fax: [phone]  Uses pill box? Yes Pt endorses 100% compliance  Follow Up:  Patient agrees to Care Plan and Follow-up.  Plan: The patient has been provided with contact information for the care management team and has been advised to call with any health related questions or concerns.   Larinda Buttery, PharmD Clinical Pharmacist [facility] Primary [facility] At [facility] [phone]

## 2023-04-30 DIAGNOSIS — H526 Other disorders of refraction: Secondary | ICD-10-CM | POA: Diagnosis not present

## 2023-04-30 DIAGNOSIS — H43813 Vitreous degeneration, bilateral: Secondary | ICD-10-CM | POA: Diagnosis not present

## 2023-04-30 DIAGNOSIS — H401111 Primary open-angle glaucoma, right eye, mild stage: Secondary | ICD-10-CM | POA: Diagnosis not present

## 2023-04-30 DIAGNOSIS — H401123 Primary open-angle glaucoma, left eye, severe stage: Secondary | ICD-10-CM | POA: Diagnosis not present

## 2023-04-30 DIAGNOSIS — H25813 Combined forms of 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, bilateral: Secondary | ICD-10-CM | POA: Diagnosis not present

## 2023-05-19 ENCOUNTER — Encounter: Payer: Self-pay | Admitting: Family Medicine

## 2023-05-19 ENCOUNTER — Ambulatory Visit (INDEPENDENT_AMBULATORY_CARE_PROVIDER_SITE_OTHER): Payer: Medicare HMO | Admitting: Family Medicine

## 2023-05-19 VITALS — BP 129/74 | HR 48 | Ht 65.0 in | Wt 161.0 lb

## 2023-05-19 DIAGNOSIS — M11272 Other chondrocalcinosis, left ankle and foot: Secondary | ICD-10-CM

## 2023-05-19 DIAGNOSIS — E790 Hyperuricemia without signs of inflammatory arthritis and tophaceous disease: Secondary | ICD-10-CM | POA: Diagnosis not present

## 2023-05-19 DIAGNOSIS — E782 Mixed hyperlipidemia: Secondary | ICD-10-CM | POA: Diagnosis not present

## 2023-05-19 DIAGNOSIS — I10 Essential (primary) hypertension: Secondary | ICD-10-CM | POA: Diagnosis not present

## 2023-05-19 DIAGNOSIS — N057 Unspecified nephritic syndrome with diffuse crescentic glomerulonephritis: Secondary | ICD-10-CM | POA: Diagnosis not present

## 2023-05-19 DIAGNOSIS — R7301 Impaired fasting glucose: Secondary | ICD-10-CM | POA: Diagnosis not present

## 2023-05-19 DIAGNOSIS — J301 Allergic rhinitis due to pollen: Secondary | ICD-10-CM | POA: Diagnosis not present

## 2023-05-19 DIAGNOSIS — E038 Other specified hypothyroidism: Secondary | ICD-10-CM | POA: Diagnosis not present

## 2023-05-19 DIAGNOSIS — E559 Vitamin D deficiency, unspecified: Secondary | ICD-10-CM | POA: Diagnosis not present

## 2023-05-19 LAB — POCT GLYCOSYLATED HEMOGLOBIN (HGB A1C): Hemoglobin A1C: 6 % — AB (ref 4.0–5.6)

## 2023-05-19 MED ORDER — AZELASTINE HCL 0.15 % NA SOLN: NASAL | 3 refills | Status: DC

## 2023-05-19 MED ORDER — COLCHICINE 0.6 MG PO TABS
0.6000 mg | ORAL_TABLET | Freq: Every day | ORAL | 1 refills | Status: DC | PRN
Start: 2023-05-19 — End: 2024-03-27

## 2023-05-19 NOTE — Assessment & Plan Note (Signed)
Goal blood pressure being under 130 pretty consistently at the gym if he is getting around 123/73 but I did encourage him to check it preworkout as well to make sure that his numbers are consistently under 130.

## 2023-05-19 NOTE — Assessment & Plan Note (Signed)
For updated lipid panel.

## 2023-05-19 NOTE — Assessment & Plan Note (Signed)
Due to recheck uric acid level. ?

## 2023-05-19 NOTE — Progress Notes (Signed)
Established Patient Office Visit  Subjective   Patient ID: Christopher Gregory, male    DOB: April 21, 1956  Age: 67 y.o. MRN: [id number]  Chief Complaint  Patient presents with   Hypertension   ifg    HPI Hypertension- Pt denies chest pain, SOB, dizziness, or heart palpitations.  Taking meds as directed w/o problems.  Denies medication side effects.    Impaired fasting glucose-no increased thirst or urination. No symptoms consistent with hypoglycemia.  He also had a gout flare last week this time it was in his knee instead of in his ankle which is the usual location he said it took him a couple days to realize that it was his gout he just woke up with it hurting and he had not had a flare in quite some time.  He says typically he can take about 4 colchicine and start to get significant relief at this time it took up to 12 tabs for him to get relief so several days.  He did complete the colchicine about 3 days ago and is doing a lot better.  He also wanted to let me know that he restarted his Astelin nasal spray.  He has had a chronic cough for quite some time well over a year he had felt like his allergies will be well-controlled with his Zyrtec and had been wearing a mask doing yard work but more recently he restarted the nasal spray and his cough completely resolved.  He would like a new updated prescription as the old one was actually expired.  Excess vitamin D-he did back down on his vitamin D to 2 days a week as his levels were elevated the last couple of times.    ROS    Objective:     BP (!) 148/80   Pulse (!) 48   Ht 5\' 5"  (1.651 m)   Wt 161 lb (73 kg)   SpO2 99%   BMI 26.79 kg/m    Physical Exam Constitutional:      Appearance: He is well-developed.  HENT:     Head: Normocephalic and atraumatic.  Cardiovascular:     Rate and Rhythm: Normal rate and regular rhythm.     Heart sounds: Normal heart sounds.  Pulmonary:     Effort: Pulmonary effort is normal.     Breath  sounds: Normal breath sounds.  Skin:    General: Skin is warm and dry.  Neurological:     Mental Status: He is alert and oriented to person, place, and time.  Psychiatric:        Behavior: Behavior normal.      Results for orders placed or performed in visit on 05/19/23  POCT glycosylated hemoglobin (Hb A1C)  Result Value Ref Range   Hemoglobin A1C 6.0 (A) 4.0 - 5.6 %   HbA1c POC (<> result, manual entry)     HbA1c, POC (prediabetic range)     HbA1c, POC (controlled diabetic range)        The 10-year ASCVD risk score (Arnett DK, et al., 2019) is: 24.3%    Assessment & Plan:   Problem List Items Addressed This Visit       Cardiovascular and Mediastinum   Essential hypertension, benign - Primary    Goal blood pressure being under 130 pretty consistently at the gym if he is getting around 123/73 but I did encourage him to check it preworkout as well to make sure that his numbers are consistently under 130.  Relevant Orders   COMPLETE METABOLIC PANEL WITH GFR   Lipid Panel w/reflex Direct LDL   Urine Microalbumin w/creat. ratio   VITAMIN D 25 Hydroxy (Vit-D Deficiency, Fractures)   PTH, Intact and Calcium     Respiratory   Allergic rhinitis    Sent over new prescription for azelastine nasal spray.  I am glad that he decided to retry it and that it actually resolved his chronic postnasal drip cough.      Relevant Medications   Azelastine HCl 0.15 % SOLN     Endocrine   IFG (impaired fasting glucose)    A1c looks great today at 6.0.  Continue to work on healthy diet and regular exercise.  He does go to the gym 4 days/week.  Plan to follow-up in 6 months.      Relevant Orders   COMPLETE METABOLIC PANEL WITH GFR   Lipid Panel w/reflex Direct LDL   Urine Microalbumin w/creat. ratio   VITAMIN D 25 Hydroxy (Vit-D Deficiency, Fractures)   PTH, Intact and Calcium   POCT glycosylated hemoglobin (Hb A1C) (Completed)   Hypothyroidism    Due to recheck TSH.  He is  asymptomatic and has not had any significant changes in skin hair or weight.      Relevant Orders   COMPLETE METABOLIC PANEL WITH GFR   Lipid Panel w/reflex Direct LDL   Urine Microalbumin w/creat. ratio   VITAMIN D 25 Hydroxy (Vit-D Deficiency, Fractures)   PTH, Intact and Calcium   TSH   Uric acid     Musculoskeletal and Integument   Pseudogout of left ankle    Due to recheck uric acid level.      Relevant Medications   colchicine 0.6 MG tablet     Genitourinary   Crescentic glomerulonephritis    Due to recheck renal function and proteinuria.  Stressed the importance of keeping blood pressure under optimal control to avoid stress or strain to the kidneys.  He does stay away from NSAIDs but does take colchicine rarely.      Relevant Orders   COMPLETE METABOLIC PANEL WITH GFR   Lipid Panel w/reflex Direct LDL   Urine Microalbumin w/creat. ratio   VITAMIN D 25 Hydroxy (Vit-D Deficiency, Fractures)   PTH, Intact and Calcium     Other   Hyperuricemia    Recheck uric acid level.      Relevant Medications   colchicine 0.6 MG tablet   Other Relevant Orders   COMPLETE METABOLIC PANEL WITH GFR   Lipid Panel w/reflex Direct LDL   Urine Microalbumin w/creat. ratio   VITAMIN D 25 Hydroxy (Vit-D Deficiency, Fractures)   PTH, Intact and Calcium   TSH   Uric acid   HLD (hyperlipidemia)    For updated lipid panel.      Relevant Orders   COMPLETE METABOLIC PANEL WITH GFR   Lipid Panel w/reflex Direct LDL   Urine Microalbumin w/creat. ratio   VITAMIN D 25 Hydroxy (Vit-D Deficiency, Fractures)   PTH, Intact and Calcium    Return in about 6 months (around 11/19/2023) for Check kidneys and BP .   I spent 30 minutes on the day of the encounter to include pre-visit record review, face-to-face time with the patient and post visit ordering of test.   Nani Gasser, MD

## 2023-05-19 NOTE — Assessment & Plan Note (Signed)
A1c looks great today at 6.0.  Continue to work on healthy diet and regular exercise.  He does go to the gym 4 days/week.  Plan to follow-up in 6 months.

## 2023-05-19 NOTE — Assessment & Plan Note (Signed)
Due to recheck renal function and proteinuria.  Stressed the importance of keeping blood pressure under optimal control to avoid stress or strain to the kidneys.  He does stay away from NSAIDs but does take colchicine rarely.

## 2023-05-19 NOTE — Assessment & Plan Note (Signed)
Recheck uric acid level. 

## 2023-05-19 NOTE — Assessment & Plan Note (Signed)
Sent over new prescription for azelastine nasal spray.  I am glad that he decided to retry it and that it actually resolved his chronic postnasal drip cough.

## 2023-05-19 NOTE — Assessment & Plan Note (Signed)
Due to recheck TSH.  He is asymptomatic and has not had any significant changes in skin hair or weight.

## 2023-05-20 LAB — COMPLETE METABOLIC PANEL WITH GFR
AG Ratio: 1.8 (calc) (ref 1.0–2.5)
ALT: 18 U/L (ref 9–46)
AST: 19 U/L (ref 10–35)
Albumin: 4.4 g/dL (ref 3.6–5.1)
Alkaline phosphatase (APISO): 43 U/L (ref 35–144)
BUN/Creatinine Ratio: 23 (calc) — ABNORMAL HIGH (ref 6–22)
BUN: 44 mg/dL — ABNORMAL HIGH (ref 7–25)
CO2: 21 mmol/L (ref 20–32)
Calcium: 9.6 mg/dL (ref 8.6–10.3)
Chloride: 106 mmol/L (ref 98–110)
Creat: 1.88 mg/dL — ABNORMAL HIGH (ref 0.70–1.35)
Globulin: 2.4 g/dL (calc) (ref 1.9–3.7)
Glucose, Bld: 133 mg/dL — ABNORMAL HIGH (ref 65–99)
Potassium: 5.4 mmol/L — ABNORMAL HIGH (ref 3.5–5.3)
Sodium: 138 mmol/L (ref 135–146)
Total Bilirubin: 1.5 mg/dL — ABNORMAL HIGH (ref 0.2–1.2)
Total Protein: 6.8 g/dL (ref 6.1–8.1)
eGFR: 38 mL/min/{1.73_m2} — ABNORMAL LOW (ref >=60)

## 2023-05-20 LAB — LIPID PANEL W/REFLEX DIRECT LDL
Cholesterol: 155 mg/dL (ref <200)
HDL: 37 mg/dL — ABNORMAL LOW (ref >=40)
LDL Cholesterol (Calc): 84 mg/dL (calc)
Non-HDL Cholesterol (Calc): 118 mg/dL (calc) (ref <130)
Total CHOL/HDL Ratio: 4.2 (calc) (ref <5.0)
Triglycerides: 261 mg/dL — ABNORMAL HIGH (ref <150)

## 2023-05-20 LAB — PTH, INTACT AND CALCIUM
Calcium: 9.6 mg/dL (ref 8.6–10.3)
PTH: 17 pg/mL (ref 16–77)

## 2023-05-20 LAB — MICROALBUMIN / CREATININE URINE RATIO
Creatinine, Urine: 39 mg/dL (ref 20–320)
Microalb Creat Ratio: 818 mg/g creat — ABNORMAL HIGH (ref <30)
Microalb, Ur: 31.9 mg/dL

## 2023-05-20 LAB — VITAMIN D 25 HYDROXY (VIT D DEFICIENCY, FRACTURES): Vit D, 25-Hydroxy: 107 ng/mL — ABNORMAL HIGH (ref 30–100)

## 2023-05-20 LAB — URIC ACID: Uric Acid, Serum: 7.1 mg/dL (ref 4.0–8.0)

## 2023-05-20 LAB — TSH: TSH: 1.83 mIU/L (ref 0.40–4.50)

## 2023-05-20 NOTE — Progress Notes (Signed)
Abdias, creatinine went up to around 1.88.  Normally you are closer to 1.5 and 1.6.  But you do look a little bit more dry on your blood work as well so would like to recheck this again in about a month instead of waiting 6 months.  Your potassium level still a little borderline elevated.  Not as high as last time but it is still high.  If you are taking anything with extra calcium please stop.  Liver function looks great.  Triglycerides are still elevated.  LDL looks better this time it is under 100, great work!  Vitamin D looks a little better it was 113 a year ago it is now down to 107.  I think you said you are taking your vitamin D twice a week, so I would recommend decreasing down to once a week so we can get that D level just under 100.  Thyroid looks great.  Your uric acid is up a little bit so that might explain why you have had a flare recently we usually like to get the uric acid level under 6.  A low purine diet is helpful, as well as taking allopurinol.  Do you need a refill on the medication?  We can always adjust your dose up a little bit to if you feel like that would be helpful to reduce your uric acid levels.  Just remember to get your tetanus updated at the local pharmacy.

## 2023-05-21 ENCOUNTER — Telehealth: Payer: Self-pay | Admitting: Family Medicine

## 2023-05-21 DIAGNOSIS — J301 Allergic rhinitis due to pollen: Secondary | ICD-10-CM

## 2023-05-21 MED ORDER — AZELASTINE HCL 0.1 % NA SOLN: 1.0000 | Freq: Two times a day (BID) | NASAL | 3 refills | Status: DC

## 2023-05-21 NOTE — Telephone Encounter (Signed)
Different dosage sent to pt's pharmacy.

## 2023-05-21 NOTE — Telephone Encounter (Signed)
Mrs Porter called.  Need the strength changed on inhaler. [facility] does not have  the current strength prescribed.

## 2023-06-11 ENCOUNTER — Other Ambulatory Visit: Payer: Self-pay | Admitting: Family Medicine

## 2023-06-11 DIAGNOSIS — M11272 Other chondrocalcinosis, left ankle and foot: Secondary | ICD-10-CM

## 2023-06-11 DIAGNOSIS — E790 Hyperuricemia without signs of inflammatory arthritis and tophaceous disease: Secondary | ICD-10-CM

## 2023-06-22 ENCOUNTER — Other Ambulatory Visit: Payer: Self-pay | Admitting: *Deleted

## 2023-06-22 DIAGNOSIS — R7989 Other specified abnormal findings of blood chemistry: Secondary | ICD-10-CM

## 2023-06-23 NOTE — Progress Notes (Signed)
Hi Christopher Gregory, kidney function is still right at 1.8 similar to a month ago.  You do look a little better hydrated this time but not as good as a year ago when the [place] and was down to 30.  Your potassium is elevated.  This can cause arrhythmias if it gets too high.  Are you currently taking any type of potassium supplement?  Anything that has extra potassium in it.  If you are please stop it.  You might want to try looking at the kidney disease supplement that you are taking.  If not taking anything with extra potassium please let me know.

## 2023-06-23 NOTE — Progress Notes (Signed)
K, we can always recheck the potassium in 2 weeks.  But if it still elevated then we need to really hone down and figure out what is going on if it is not from the diet.

## 2023-06-29 NOTE — Progress Notes (Signed)
I think okay to eat his typical grain cereal and his [place] nuts.  Lets maybe just have him avoid eating the potato within a day or 2 of getting the blood work drawn and limiting the cashew nuts to maybe 15 when he does eat them.  And lets see if the numbers look better.  Colchicine 2 weeks prior should not impact the renal function that day.

## 2023-09-07 ENCOUNTER — Encounter: Payer: Self-pay | Admitting: Family Medicine

## 2023-09-07 ENCOUNTER — Ambulatory Visit (INDEPENDENT_AMBULATORY_CARE_PROVIDER_SITE_OTHER): Payer: Medicare HMO | Admitting: Family Medicine

## 2023-09-07 VITALS — BP 134/70 | HR 54 | Ht 65.0 in | Wt 161.0 lb

## 2023-09-07 DIAGNOSIS — L237 Allergic contact dermatitis due to plants, except food: Secondary | ICD-10-CM | POA: Diagnosis not present

## 2023-09-07 DIAGNOSIS — R21 Rash and other nonspecific skin eruption: Secondary | ICD-10-CM | POA: Diagnosis not present

## 2023-09-07 MED ORDER — BETAMETHASONE DIPROPIONATE 0.05 % EX CREA: TOPICAL_CREAM | Freq: Two times a day (BID) | CUTANEOUS | 0 refills | Status: DC

## 2023-09-07 NOTE — Progress Notes (Signed)
Acute Office Visit  Subjective:     Patient ID: Christopher Gregory, male    DOB: [birth year], 67 y.o.   MRN: [id number]  Chief Complaint  Patient presents with   Rash    Bilateral wrists are affected x 1 month he has been using calamine lotion he stated that he got into some poison oak    HPI Patient is in today for rash on wrists x 1 months.  Using calamine lotion.  Says it is very itchy at times.  He is pretty sure it is poison ivy because it started after he had been clearing some branches below a tree that had poison ivy wrapped around the tree.  He says normally calamine lotion tends to take care of it but this time it does not really seem to be helping he just has it on both wrists.  Nowhere else on his body.  ROS      Objective:    BP 134/70   Pulse (!) 54   Ht 5\' 5"  (1.651 m)   Wt 161 lb (73 kg)   SpO2 99%   BMI 26.79 kg/m    Physical Exam Skin:    Comments: Erythematous maculopapular rash with thick scale on both anterior wrists.  Little bit more widespread on the left when compared to his right.     No results found for any visits on 09/07/23.      Assessment & Plan:   Problem List Items Addressed This Visit   None Visit Diagnoses     Rash    -  Primary   Poison ivy dermatitis          Treat with stronger topical steroid cream twice a day for the next week.  Just apply thin layer.  If not significantly better by Monday then please let us know.  Avoid scratching at the area he has been using ice to help with the itching.  Did discuss that if the plant oil that causes the reaction to make sure that he is not reusing any clothing or gloves that became exposed without washing in hot water.  Meds ordered this encounter  Medications   betamethasone dipropionate 0.05 % cream    Sig: Apply topically 2 (two) times daily.    Dispense:  30 g    Refill:  0    No follow-ups on file.  Nani Gasser, MD

## 2023-10-23 ENCOUNTER — Other Ambulatory Visit: Payer: Self-pay | Admitting: Family Medicine

## 2023-10-23 DIAGNOSIS — I10 Essential (primary) hypertension: Secondary | ICD-10-CM

## 2023-10-23 DIAGNOSIS — E038 Other specified hypothyroidism: Secondary | ICD-10-CM

## 2023-10-23 DIAGNOSIS — M11272 Other chondrocalcinosis, left ankle and foot: Secondary | ICD-10-CM

## 2023-10-29 ENCOUNTER — Telehealth: Payer: Self-pay

## 2023-10-29 NOTE — Telephone Encounter (Signed)
Patient wife states requested refills not needed will last until next schld appt on 11/23/22

## 2023-11-01 DIAGNOSIS — H401123 Primary open-angle glaucoma, left eye, severe stage: Secondary | ICD-10-CM | POA: Diagnosis not present

## 2023-11-01 DIAGNOSIS — H401111 Primary open-angle glaucoma, right eye, mild stage: Secondary | ICD-10-CM | POA: Diagnosis not present

## 2023-11-08 DIAGNOSIS — H524 Presbyopia: Secondary | ICD-10-CM | POA: Diagnosis not present

## 2023-11-08 DIAGNOSIS — H52209 Unspecified astigmatism, unspecified eye: Secondary | ICD-10-CM | POA: Diagnosis not present

## 2023-11-08 DIAGNOSIS — H5213 Myopia, bilateral: Secondary | ICD-10-CM | POA: Diagnosis not present

## 2023-11-24 ENCOUNTER — Encounter: Payer: Self-pay | Admitting: Family Medicine

## 2023-11-24 ENCOUNTER — Ambulatory Visit (INDEPENDENT_AMBULATORY_CARE_PROVIDER_SITE_OTHER): Payer: Medicare HMO | Admitting: Family Medicine

## 2023-11-24 VITALS — BP 127/67 | HR 49 | Ht 65.0 in | Wt 158.0 lb

## 2023-11-24 DIAGNOSIS — N1831 Chronic kidney disease, stage 3a: Secondary | ICD-10-CM | POA: Diagnosis not present

## 2023-11-24 DIAGNOSIS — E875 Hyperkalemia: Secondary | ICD-10-CM

## 2023-11-24 DIAGNOSIS — R7301 Impaired fasting glucose: Secondary | ICD-10-CM

## 2023-11-24 DIAGNOSIS — I10 Essential (primary) hypertension: Secondary | ICD-10-CM

## 2023-11-24 DIAGNOSIS — E673 Hypervitaminosis D: Secondary | ICD-10-CM

## 2023-11-24 DIAGNOSIS — E790 Hyperuricemia without signs of inflammatory arthritis and tophaceous disease: Secondary | ICD-10-CM | POA: Diagnosis not present

## 2023-11-24 DIAGNOSIS — N032 Chronic nephritic syndrome with diffuse membranous glomerulonephritis: Secondary | ICD-10-CM

## 2023-11-24 DIAGNOSIS — N1832 Chronic kidney disease, stage 3b: Secondary | ICD-10-CM

## 2023-11-24 LAB — POCT GLYCOSYLATED HEMOGLOBIN (HGB A1C): Hemoglobin A1C: 5.9 % — AB (ref 4.0–5.6)

## 2023-11-24 NOTE — Progress Notes (Signed)
Established Patient Office Visit  Subjective  Patient ID: Christopher Gregory, male    DOB: August 06, 1956  Age: 68 y.o. MRN: [id number]  Chief Complaint  Patient presents with   ifg    HPI  Impaired fasting glucose-no increased thirst or urination. No symptoms consistent with hypoglycemia.  Also here for follow-up chronically impaired renal function  He also wanted to let me know that he had recently tried drinking some tart cherry juice in place of his allopurinol to see if he could switch to something a little bit more natural as he had a friend who has done this successfully.  Unfortunately he had 2 flares in a month's time and so restarted his allopurinol and feels like his gout is back under better control.  Still getting intermittent rash on his wrists.  He has been using a moisturizing soap.  No known triggers or contact dermatitis that he is aware of.  Last time we did labs he had elevated BUN and creatinine as well as hyperkalemia.    ROS    Objective:     BP 127/67   Pulse (!) 49   Ht 5\' 5"  (1.651 m)   Wt 158 lb (71.7 kg)   SpO2 99%   BMI 26.29 kg/m    Physical Exam Vitals and nursing note reviewed.  Constitutional:      Appearance: Normal appearance.  HENT:     Head: Normocephalic and atraumatic.  Eyes:     Conjunctiva/sclera: Conjunctivae normal.  Cardiovascular:     Rate and Rhythm: Normal rate and regular rhythm.  Pulmonary:     Effort: Pulmonary effort is normal.     Breath sounds: Normal breath sounds.  Musculoskeletal:     Cervical back: Neck supple.  Skin:    General: Skin is warm and dry.  Neurological:     Mental Status: He is alert.  Psychiatric:        Mood and Affect: Mood normal.      Results for orders placed or performed in visit on 11/24/23  CMP14+EGFR  Result Value Ref Range   Glucose 144 (H) 70 - 99 mg/dL   BUN 40 (H) 8 - 27 mg/dL   Creatinine, Ser 4.78 (H) 0.76 - 1.27 mg/dL   eGFR 37 (L) >29 FA/OZH/0.86   BUN/Creatinine Ratio  21 10 - 24   Sodium 139 134 - 144 mmol/L   Potassium 5.2 3.5 - 5.2 mmol/L   Chloride 104 96 - 106 mmol/L   CO2 21 20 - 29 mmol/L   Calcium 10.2 8.6 - 10.2 mg/dL   Total Protein 6.8 6.0 - 8.5 g/dL   Albumin 4.4 3.9 - 4.9 g/dL   Globulin, Total 2.4 1.5 - 4.5 g/dL   Bilirubin Total 0.9 0.0 - 1.2 mg/dL   Alkaline Phosphatase 52 44 - 121 IU/L   AST 20 0 - 40 IU/L   ALT 17 0 - 44 IU/L  Uric acid  Result Value Ref Range   Uric Acid 7.9 3.8 - 8.4 mg/dL  Hemoglobin V7Q  Result Value Ref Range   Hgb A1c MFr Bld 5.8 (H) 4.8 - 5.6 %   Est. average glucose Bld gHb Est-mCnc 120 mg/dL  VITAMIN D 25 Hydroxy (Vit-D Deficiency, Fractures)  Result Value Ref Range   Vit D, 25-Hydroxy 120.0 (H) 30.0 - 100.0 ng/mL  PTH, Intact and Calcium  Result Value Ref Range   PTH WILL FOLLOW    PTH Interp Comment   Urine Microalbumin w/creat. ratio  Result Value Ref Range   Creatinine, Urine WILL FOLLOW    Microalbumin, Urine WILL FOLLOW    Microalb/Creat Ratio WILL FOLLOW   POCT HgB A1C  Result Value Ref Range   Hemoglobin A1C 5.9 (A) 4.0 - 5.6 %   HbA1c POC (<> result, manual entry)     HbA1c, POC (prediabetic range)     HbA1c, POC (controlled diabetic range)        The 10-year ASCVD risk score (Arnett DK, et al., 2019) is: 19.4%    Assessment & Plan:   Problem List Items Addressed This Visit       Cardiovascular and Mediastinum   Essential hypertension, benign   Pressure looks fantastic today.  We did discuss that we may discontinue the spironolactone especially if his BUN and creatinine are still elevated.  He says he drinks plenty of water but labs show that he is a little dehydrated.  We did discuss coming off of this medication as he is already on furosemide.      Relevant Orders   CMP14+EGFR (Completed)   Uric acid (Completed)   Hemoglobin A1c (Completed)   VITAMIN D 25 Hydroxy (Vit-D Deficiency, Fractures) (Completed)   PTH, Intact and Calcium (Completed)   Urine Microalbumin  w/creat. ratio (Completed)     Endocrine   IFG (impaired fasting glucose)   Will get up-to-date A1c.  Continue to work on [employer].      Relevant Orders   CMP14+EGFR (Completed)   Uric acid (Completed)   Hemoglobin A1c (Completed)   VITAMIN D 25 Hydroxy (Vit-D Deficiency, Fractures) (Completed)   PTH, Intact and Calcium (Completed)   Urine Microalbumin w/creat. 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  He has not seen nephrology in a couple of years.      Relevant Orders   CMP14+EGFR (Completed)   Uric acid (Completed)   Hemoglobin A1c (Completed)   VITAMIN D 25 Hydroxy (Vit-D Deficiency, Fractures) (Completed)   PTH, Intact and Calcium (Completed)   Urine Microalbumin w/creat. 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 Theressa Stamps.        Other   Hyperuricemia   Relevant Orders   CMP14+EGFR (Completed)   Uric acid (Completed)   Hemoglobin A1c (Completed)   VITAMIN D 25 Hydroxy (Vit-D Deficiency, Fractures) (Completed)   PTH, Intact and Calcium (Completed)   Urine Microalbumin w/creat. ratio (Completed)   Other Visit Diagnoses       Hypervitaminosis D       Relevant Orders   CMP14+EGFR (Completed)   Uric acid (Completed)   Hemoglobin A1c (Completed)   VITAMIN D 25 Hydroxy (Vit-D Deficiency, Fractures) (Completed)   PTH, Intact and Calcium (Completed)   Urine Microalbumin w/creat. ratio (Completed)     Hyperkalemia       Relevant Orders   CMP14+EGFR (Completed)   Uric acid (Completed)   Hemoglobin A1c (Completed)   VITAMIN D 25 Hydroxy (Vit-D Deficiency, Fractures) (Completed)   PTH, Intact and Calcium (Completed)   Urine Microalbumin w/creat. ratio (Completed)       Hyperkalemia-Labs at last office visit  showed elevated potassium levels he is taking spironolactone we will recheck today if still elevated then we will need to decrease the frequency or either discontinue  the medication being that his BUN has been elevated the last couple times as well I would lean towards just discontinuing it completely.  Also previously he had a deficiency of vitamin D bit on last set of labs he actually had elevated levels and so had asked him to decrease how often he was taking it.  To recheck levels today.  Return in about 6 months (around 05/23/2024) for Pre-diabetes, Hypertension.    Nani Gasser, MD

## 2023-11-25 DIAGNOSIS — N032 Chronic nephritic syndrome with diffuse membranous glomerulonephritis: Secondary | ICD-10-CM | POA: Insufficient documentation

## 2023-11-25 NOTE — Assessment & Plan Note (Signed)
Will get up-to-date A1c.  Continue to work on [employer].

## 2023-11-25 NOTE — Progress Notes (Signed)
Hi Christopher Gregory, lets go ahead and stop the spironolactone completely.  I am going to see if that helps a little bit with the hydration and your kidneys I really like to see that BUN a little closer back to 20s or 30s instead of up in the 40 range and I know you do a great job with hydration.  Your A1c is stable at 5.8.  Vitamin D is still too high.  I know we had talked before about decreasing the frequency of how often you are taking it so please decrease the frequency.  Vitamin D is a fat-soluble vitamin and can be toxic to the liver if taken too much.  Uric acid level is at 7.9 goal is under 6 to keep working on a low purine diet as well to help improve your gout.  PTH and urine sample still pending.

## 2023-11-25 NOTE — Assessment & Plan Note (Signed)
Pressure looks fantastic today.  We did discuss that we may discontinue the spironolactone especially if his BUN and creatinine are still elevated.  He says he drinks plenty of water but labs show that he is a little dehydrated.  We did discuss coming off of this medication as he is already on furosemide.

## 2023-11-25 NOTE — Assessment & Plan Note (Signed)
Previously followed by Dr. Theressa Stamps.

## 2023-11-25 NOTE — Assessment & Plan Note (Signed)
Get updated labs today as well as urine microalbumin.  He has not seen nephrology in a couple of years.

## 2023-11-26 ENCOUNTER — Other Ambulatory Visit: Payer: Self-pay | Admitting: Family Medicine

## 2023-11-26 ENCOUNTER — Telehealth: Payer: Self-pay | Admitting: Family Medicine

## 2023-11-26 LAB — CMP14+EGFR
ALT: 17 [IU]/L (ref 0–44)
AST: 20 [IU]/L (ref 0–40)
Albumin: 4.4 g/dL (ref 3.9–4.9)
Alkaline Phosphatase: 52 [IU]/L (ref 44–121)
BUN/Creatinine Ratio: 21 (ref 10–24)
BUN: 40 mg/dL — ABNORMAL HIGH (ref 8–27)
Bilirubin Total: 0.9 mg/dL (ref 0.0–1.2)
CO2: 21 mmol/L (ref 20–29)
Calcium: 10.2 mg/dL (ref 8.6–10.2)
Chloride: 104 mmol/L (ref 96–106)
Creatinine, Ser: 1.91 mg/dL — ABNORMAL HIGH (ref 0.76–1.27)
Globulin, Total: 2.4 g/dL (ref 1.5–4.5)
Glucose: 144 mg/dL — ABNORMAL HIGH (ref 70–99)
Potassium: 5.2 mmol/L (ref 3.5–5.2)
Sodium: 139 mmol/L (ref 134–144)
Total Protein: 6.8 g/dL (ref 6.0–8.5)
eGFR: 37 mL/min/{1.73_m2} — ABNORMAL LOW (ref >59)

## 2023-11-26 LAB — PTH, INTACT AND CALCIUM: PTH: 21 pg/mL (ref 15–65)

## 2023-11-26 LAB — MICROALBUMIN / CREATININE URINE RATIO
Creatinine, Urine: 43.8 mg/dL
Microalb/Creat Ratio: 466 mg/g{creat} — ABNORMAL HIGH (ref 0–29)
Microalbumin, Urine: 203.9 ug/mL

## 2023-11-26 LAB — VITAMIN D 25 HYDROXY (VIT D DEFICIENCY, FRACTURES): Vit D, 25-Hydroxy: 120 ng/mL — ABNORMAL HIGH (ref 30.0–100.0)

## 2023-11-26 LAB — HEMOGLOBIN A1C
Est. average glucose Bld gHb Est-mCnc: 120 mg/dL
Hgb A1c MFr Bld: 5.8 % — ABNORMAL HIGH (ref 4.8–5.6)

## 2023-11-26 LAB — URIC ACID: Uric Acid: 7.9 mg/dL (ref 3.8–8.4)

## 2023-11-26 NOTE — Telephone Encounter (Signed)
I have cancelled prescription.

## 2023-11-26 NOTE — Telephone Encounter (Signed)
Please call center [facility] and cancel all remaining refills for spironolactone.  I just do not want him to accidentally restarted it.

## 2023-11-26 NOTE — Progress Notes (Signed)
PTH is normal.  Protein still present in the urine but better than last time.

## 2023-12-07 ENCOUNTER — Encounter: Payer: Medicare HMO | Admitting: Family Medicine

## 2023-12-07 ENCOUNTER — Telehealth: Payer: Self-pay

## 2023-12-07 NOTE — Telephone Encounter (Signed)
error

## 2024-02-22 ENCOUNTER — Ambulatory Visit: Admitting: Sports Medicine

## 2024-03-03 ENCOUNTER — Encounter: Payer: Self-pay | Admitting: Sports Medicine

## 2024-03-03 ENCOUNTER — Other Ambulatory Visit (INDEPENDENT_AMBULATORY_CARE_PROVIDER_SITE_OTHER)

## 2024-03-03 ENCOUNTER — Ambulatory Visit (INDEPENDENT_AMBULATORY_CARE_PROVIDER_SITE_OTHER): Admitting: Sports Medicine

## 2024-03-03 DIAGNOSIS — M11272 Other chondrocalcinosis, left ankle and foot: Secondary | ICD-10-CM | POA: Diagnosis not present

## 2024-03-03 DIAGNOSIS — E790 Hyperuricemia without signs of inflammatory arthritis and tophaceous disease: Secondary | ICD-10-CM

## 2024-03-03 MED ORDER — TRIAMCINOLONE ACETONIDE 40 MG/ML IJ SUSP
40.0000 mg | Freq: Once | INTRAMUSCULAR | Status: AC
Start: 2024-03-03 — End: 2024-03-03
  Administered 2024-03-03: 40 mg via INTRAMUSCULAR

## 2024-03-03 NOTE — Progress Notes (Addendum)
    Procedures performed today:    Procedure: Real-time Ultrasound Guided aspiration/injection of left ankle joint Device: Samsung [id number]  Verbal informed consent obtained.  Time-out conducted.  Noted no overlying erythema, induration, or other signs of local infection.  Skin prepped in a sterile fashion.  Local anesthesia: Topical Ethyl chloride.  With sterile technique and under real time ultrasound guidance: Noted effusion, I advanced a 22-gauge needle into the tibiotalar joint, aspirated approximately 1 to 2 mL of straw-colored fluid, syringe switched and 1 cc kenalog  40, 2 cc lidocaine, 2 cc bupivacaine injected easily. Completed without difficulty  Advised to call if fevers/chills, erythema, induration, drainage, or persistent bleeding.  Images permanently stored and available for review in PACS.  Impression: Technically successful ultrasound guided aspiration/injection.  Independent interpretation of notes and tests performed by another provider:   None.  Brief History, Exam, Impression, and Recommendations:    Pseudogout of left ankle This pleasant 68 year old male with history of arthrocentesis confirmed pseudogout of the left ankle returns, he has had intermittent increasing swelling and pain left ankle. He is taking allopurinol  for a different reason, he did have some hyperuricemia in the distant past, his allopurinol  in this case is more to prevent uric acid stones but he does not have true gout. We did an injection back in 2020, due to his recurrent pain and spite of a few days of colchicine  we did an aspiration and injection. We will send the aspirate off for crystal analysis (lavender top tube refrigerated is the requirement) He can use colchicine  twice a day for 5 to 7 days for flares. We are avoiding NSAIDs due to his CKD. I would like to see him back in 6 weeks and if persistent symptomatology we will consider daily colchicine  for prevention.  Hyperuricemia Rydell does  have a history of hyperuricemia, he has pseudogout confirmed with ankle arthrocentesis and calcium  pyrophosphate crystals noted, he does not have true gout. The treatment of his hyperuricemia is more to prevent uric acid kidney stones as opposed to preventing flares of inflammatory arthritis.    ____________________________________________ Joselyn Nicely. Sandy Crumb, M.D., ABFM., CAQSM., AME. Primary Care and Sports Medicine [facility]  Adjunct Professor of [facility] Medicine  [employer]  Restaurant manager, fast food

## 2024-03-03 NOTE — Assessment & Plan Note (Signed)
 Christopher Gregory does have a history of hyperuricemia, he has pseudogout confirmed with ankle arthrocentesis and calcium  pyrophosphate crystals noted, he does not have true gout. The treatment of his hyperuricemia is more to prevent uric acid kidney stones as opposed to preventing flares of inflammatory arthritis.

## 2024-03-03 NOTE — Addendum Note (Signed)
 Addended by: Burton Casey L on: 03/03/2024 11:10 AM   Modules accepted: Orders

## 2024-03-03 NOTE — Assessment & Plan Note (Addendum)
 This pleasant 68 year old male with history of arthrocentesis confirmed pseudogout of the left ankle returns, he has had intermittent increasing swelling and pain left ankle. He is taking allopurinol  for a different reason, he did have some hyperuricemia in the distant past, his allopurinol  in this case is more to prevent uric acid stones but he does not have true gout. We did an injection back in 2020, due to his recurrent pain and spite of a few days of colchicine  we did an aspiration and injection. We will send the aspirate off for crystal analysis (lavender top tube refrigerated is the requirement) He can use colchicine  twice a day for 5 to 7 days for flares. We are avoiding NSAIDs due to his CKD. I would like to see him back in 6 weeks and if persistent symptomatology we will consider daily colchicine  for prevention.

## 2024-03-04 LAB — SYNOVIAL FLUID PANEL

## 2024-03-06 LAB — SYNOVIAL FLUID PANEL
Glucose, Fluid: 203 mg/dL
Protein, Fluid: 1.8 g/dL

## 2024-03-06 LAB — SPECIMEN STATUS REPORT

## 2024-03-25 ENCOUNTER — Other Ambulatory Visit: Payer: Self-pay | Admitting: Family Medicine

## 2024-03-25 DIAGNOSIS — E790 Hyperuricemia without signs of inflammatory arthritis and tophaceous disease: Secondary | ICD-10-CM

## 2024-03-25 DIAGNOSIS — M11272 Other chondrocalcinosis, left ankle and foot: Secondary | ICD-10-CM

## 2024-04-12 ENCOUNTER — Ambulatory Visit (INDEPENDENT_AMBULATORY_CARE_PROVIDER_SITE_OTHER): Admitting: Sports Medicine

## 2024-04-12 DIAGNOSIS — M11272 Other chondrocalcinosis, left ankle and foot: Secondary | ICD-10-CM | POA: Diagnosis not present

## 2024-04-12 NOTE — Assessment & Plan Note (Signed)
 This very pleasant 68 year old male with a history of arthrocentesis confirmed pseudogout of the left ankle returns, he does get intermittent swelling and pain, he does take allopurinol  for hyperuricemia though he does not have true gout, his allopurinol  is more to prevent uric acid stones. His last ankle injection was in 2020, we did another aspiration and injection on March 03, 2024. He has responded well. The aspirate was negative for crystals. We have been avoiding NSAIDs due to his CKD. He wears a lace up ankle brace. He is using colchicine  approximately 4 times per week only when he works out and this regimen controls his discomfort very well. He will continue to get his labs monitored yearly with his PCP, and I think we can continue the above indefinitely.

## 2024-04-12 NOTE — Progress Notes (Signed)
    Procedures performed today:    None.  Independent interpretation of notes and tests performed by another provider:   None.  Brief History, Exam, Impression, and Recommendations:    Pseudogout of left ankle This very pleasant 68 year old male with a history of arthrocentesis confirmed pseudogout of the left ankle returns, he does get intermittent swelling and pain, he does take allopurinol  for hyperuricemia though he does not have true gout, his allopurinol  is more to prevent uric acid stones. His last ankle injection was in 2020, we did another aspiration and injection on March 03, 2024. He has responded well. The aspirate was negative for crystals. We have been avoiding NSAIDs due to his CKD. He wears a lace up ankle brace. He is using colchicine  approximately 4 times per week only when he works out and this regimen controls his discomfort very well. He will continue to get his labs monitored yearly with his PCP, and I think we can continue the above indefinitely.    ____________________________________________ Joselyn Nicely. Sandy Crumb, M.D., ABFM., CAQSM., AME. Primary Care and Sports Medicine [facility]  Adjunct Professor of [employer]  [employer]  Restaurant manager, fast food

## 2024-04-28 DIAGNOSIS — H526 Other disorders of refraction: Secondary | ICD-10-CM | POA: Diagnosis not present

## 2024-04-28 DIAGNOSIS — H401123 Primary open-angle glaucoma, left eye, severe stage: Secondary | ICD-10-CM | POA: Diagnosis not present

## 2024-04-28 DIAGNOSIS — H25813 Combined forms of 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, bilateral: Secondary | ICD-10-CM | POA: Diagnosis not present

## 2024-04-28 DIAGNOSIS — H43813 Vitreous degeneration, bilateral: Secondary | ICD-10-CM | POA: Diagnosis not present

## 2024-04-28 DIAGNOSIS — H401111 Primary open-angle glaucoma, right eye, mild stage: Secondary | ICD-10-CM | POA: Diagnosis not present

## 2024-05-24 ENCOUNTER — Ambulatory Visit (INDEPENDENT_AMBULATORY_CARE_PROVIDER_SITE_OTHER): Payer: Medicare HMO | Admitting: Family Medicine

## 2024-05-24 VITALS — BP 140/82 | HR 50 | Ht 65.0 in | Wt 158.1 lb

## 2024-05-24 DIAGNOSIS — E673 Hypervitaminosis D: Secondary | ICD-10-CM

## 2024-05-24 DIAGNOSIS — N1832 Chronic kidney disease, stage 3b: Secondary | ICD-10-CM

## 2024-05-24 DIAGNOSIS — R7301 Impaired fasting glucose: Secondary | ICD-10-CM | POA: Diagnosis not present

## 2024-05-24 DIAGNOSIS — E790 Hyperuricemia without signs of inflammatory arthritis and tophaceous disease: Secondary | ICD-10-CM | POA: Diagnosis not present

## 2024-05-24 DIAGNOSIS — M11272 Other chondrocalcinosis, left ankle and foot: Secondary | ICD-10-CM | POA: Diagnosis not present

## 2024-05-24 DIAGNOSIS — N032 Chronic nephritic syndrome with diffuse membranous glomerulonephritis: Secondary | ICD-10-CM | POA: Diagnosis not present

## 2024-05-24 DIAGNOSIS — I10 Essential (primary) hypertension: Secondary | ICD-10-CM

## 2024-05-24 LAB — POCT GLYCOSYLATED HEMOGLOBIN (HGB A1C): Hemoglobin A1C: 6 % — AB (ref 4.0–5.6)

## 2024-05-24 NOTE — Patient Instructions (Signed)
 If labs are about the same will plan to restart spironolactone .

## 2024-05-24 NOTE — Assessment & Plan Note (Signed)
 Consider restart spironolactone .

## 2024-05-24 NOTE — Assessment & Plan Note (Signed)
 A1C up a little to 6.0.  Just continue to work on cutting out sugary foods and excess fruits.  He has been eating a lot of fruit lately.  We just discussed the importance of portion control with that

## 2024-05-24 NOTE — Progress Notes (Unsigned)
   Established Patient Office Visit  Subjective  Patient ID: Chrisean Kloth, male    DOB: Mar 28, 1956  Age: 68 y.o. MRN: [id number]  Chief Complaint  Patient presents with  . Hypertension  . ifg    HPI Hypertension- Pt denies chest pain, SOB, dizziness, or heart palpitations.  Taking meds as directed w/o problems.  Denies medication side effects.    Impaired fasting glucose-no increased thirst or urination. No symptoms consistent with hypoglycemia.  {History (Optional):23778}  ROS    Objective:     BP (!) 170/77   Pulse (!) 50   Ht 5' 5 (1.651 m)   Wt 158 lb 1.9 oz (71.7 kg)   SpO2 100%   BMI 26.31 kg/m  {Vitals History (Optional):23777}  Physical Exam   Results for orders placed or performed in visit on 05/24/24  POCT HgB A1C  Result Value Ref Range   Hemoglobin A1C 6.0 (A) 4.0 - 5.6 %   HbA1c POC (<> result, manual entry)     HbA1c, POC (prediabetic range)     HbA1c, POC (controlled diabetic range)      {Labs (Optional):23779}  The 10-year ASCVD risk score (Arnett DK, et al., 2019) is: 30.5%    Assessment & Plan:   Problem List Items Addressed This Visit       Endocrine   IFG (impaired fasting glucose) - Primary   Relevant Orders   POCT HgB A1C (Completed)    No follow-ups on file.    Dorothyann Byars, MD

## 2024-05-25 ENCOUNTER — Encounter: Payer: Self-pay | Admitting: Family Medicine

## 2024-05-25 ENCOUNTER — Ambulatory Visit: Payer: Self-pay | Admitting: Family Medicine

## 2024-05-25 LAB — CMP14+EGFR
ALT: 16 IU/L (ref 0–44)
AST: 20 IU/L (ref 0–40)
Albumin: 4.1 g/dL (ref 3.9–4.9)
Alkaline Phosphatase: 68 IU/L (ref 44–121)
BUN/Creatinine Ratio: 18 (ref 10–24)
BUN: 26 mg/dL (ref 8–27)
Bilirubin Total: 1.3 mg/dL — ABNORMAL HIGH (ref 0.0–1.2)
CO2: 20 mmol/L (ref 20–29)
Calcium: 9.9 mg/dL (ref 8.6–10.2)
Chloride: 103 mmol/L (ref 96–106)
Creatinine, Ser: 1.42 mg/dL — ABNORMAL HIGH (ref 0.76–1.27)
Globulin, Total: 2.4 g/dL (ref 1.5–4.5)
Glucose: 139 mg/dL — ABNORMAL HIGH (ref 70–99)
Potassium: 4.9 mmol/L (ref 3.5–5.2)
Sodium: 139 mmol/L (ref 134–144)
Total Protein: 6.5 g/dL (ref 6.0–8.5)
eGFR: 53 mL/min/1.73 — ABNORMAL LOW (ref >59)

## 2024-05-25 LAB — URIC ACID: Uric Acid: 6 mg/dL (ref 3.8–8.4)

## 2024-05-25 LAB — MICROALBUMIN / CREATININE URINE RATIO
Creatinine, Urine: 59.6 mg/dL
Microalb/Creat Ratio: 2273 mg/g{creat} — ABNORMAL HIGH (ref 0–29)
Microalbumin, Urine: 1354.9 ug/mL

## 2024-05-25 LAB — VITAMIN D 25 HYDROXY (VIT D DEFICIENCY, FRACTURES): Vit D, 25-Hydroxy: 108 ng/mL — ABNORMAL HIGH (ref 30.0–100.0)

## 2024-05-25 NOTE — Progress Notes (Signed)
 Hi [name], your kidney function actually does look a little bit better off the spironolactone  in fact your BUN came back into the normal range and your creatinine came from 1.9 down to 1.4.  So I think instead of restarting the spironolactone  work and the need to just add something else to your regimen to keep the blood pressure under control and then probably recheck your kidney function in a couple weeks to make sure that they are playing well nicely with each other.  Vitamin D  looks a little better.  I know we had discussed that last time it was a little too high and it really needs to be closer to 200 instead of greater than 100 so it does look better.  Uric acid level actually looks great at 6.0 so it is much better than it was.  So not can make any adjustments on the allopurinol .  As far as the blood pressure goes we could either try increasing the amlodipine  to see if that helps or we could add something to your regimen.  Let me know if you have a preference.

## 2024-05-25 NOTE — Assessment & Plan Note (Signed)
 Doing better with the addition of the colchicine .  Last uric acid level was still quite elevated around 9 and previous levels even higher.  We did discuss that we could make an adjustment to the allopurinol  if need be but again we will check labs first.

## 2024-05-25 NOTE — Assessment & Plan Note (Signed)
 To monitor renal function every 6 months.  He still has some significant proteinuria but really does not want to follow back up with nephrology.  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

## 2024-05-25 NOTE — Assessment & Plan Note (Signed)
 Taking his high-dose vitamin D .  Last time we checked his labs they were a little on the high end and so I encouraged him to decrease the frequency of how often he is taking it believe he is taking 30,000 units twice a week at this point.

## 2024-05-26 MED ORDER — HYDRALAZINE HCL 10 MG PO TABS: 10.0000 mg | ORAL_TABLET | Freq: Three times a day (TID) | ORAL | 3 refills | Status: DC

## 2024-05-26 NOTE — Progress Notes (Signed)
 [place], the urine sample finally came back.  There was a little bit of a delay but it actually showed a significant bump up in protein loss similar to about 2 years ago.  I know you are hesitant to go back to nephrology but I really would like for you to see them again if they say there is still nothing different that we should do then that is fine.  We could even send you elsewhere if you want a second opinion.  Just let me know.

## 2024-05-26 NOTE — Telephone Encounter (Signed)
 Meds ordered this encounter  Medications   hydrALAZINE (APRESOLINE) 10 MG tablet    Sig: Take 1 tablet (10 mg total) by mouth 3 (three) times daily.    Dispense:  90 tablet    Refill:  3

## 2024-06-13 ENCOUNTER — Ambulatory Visit (INDEPENDENT_AMBULATORY_CARE_PROVIDER_SITE_OTHER): Admitting: Family Medicine

## 2024-06-13 VITALS — BP 147/74 | HR 55 | Ht 65.0 in | Wt 162.0 lb

## 2024-06-13 DIAGNOSIS — I10 Essential (primary) hypertension: Secondary | ICD-10-CM

## 2024-06-13 MED ORDER — HYDRALAZINE HCL 25 MG PO TABS: 25.0000 mg | ORAL_TABLET | Freq: Three times a day (TID) | ORAL | 3 refills | Status: DC

## 2024-06-13 NOTE — Progress Notes (Signed)
 Will increase hydralazine  to 25 mg though still encouraged him to try to get that middle of the day dose then if at all possible.  Recommend return in 3 to 4 weeks for nurse visit.  Meds ordered this encounter  Medications   hydrALAZINE  (APRESOLINE ) 25 MG tablet    Sig: Take 1 tablet (25 mg total) by mouth 3 (three) times daily.    Dispense:  90 tablet    Refill:  3

## 2024-06-13 NOTE — Progress Notes (Signed)
 Patient is here for blood pressure check.   Previous BP was 140/82  1st BP today: 147/74  2nd BP today (after 10  minutes): 146/72  Patient started Hydralazine  10mg  tablet. States he was attempting to take med TID but that it was hard to keep to that schedule. He admits to missing a few doses while on vacation. Took BID yesterday.   Per Dr. Alvan, possibly increasing Hydralazine  to 25mg  to help cover any missed doses. Due to CKD4 patient is unable to change meds at this time. Pt has been advised.

## 2024-06-13 NOTE — Patient Instructions (Signed)
 Take Hydralazine  25mg  three times a day. Medication dosage has been increased to help cover for any missed doses.

## 2024-06-27 ENCOUNTER — Ambulatory Visit

## 2024-06-29 ENCOUNTER — Ambulatory Visit (INDEPENDENT_AMBULATORY_CARE_PROVIDER_SITE_OTHER)

## 2024-06-29 VITALS — BP 144/75 | HR 58

## 2024-06-29 DIAGNOSIS — I10 Essential (primary) hypertension: Secondary | ICD-10-CM

## 2024-06-29 NOTE — Progress Notes (Signed)
 Pt presents in office for a blood check pt states his pressures at home are improved, pt states he went to the gym and his blood pressure was 146/70. Pt first blood pressure in office was 139/70. Pt states he has had some side effects since starting the hydralazine  25 mg  pt states he had a bad reaction including bilat ankle and joint pain, today was the first day  he was able to get around the house, he has since changed his medication back to 10 mg. And has only skipped 1 dose., pt second reading in office was 144/75.  Pt was asked to return to clinic in 2 weeks for b/p check.

## 2024-06-29 NOTE — Patient Instructions (Signed)
 Pt is to return  back to clinic in 2 weeks for a nurse visit to check b/p

## 2024-07-11 ENCOUNTER — Encounter: Payer: Self-pay | Admitting: Sports Medicine

## 2024-07-13 ENCOUNTER — Ambulatory Visit

## 2024-07-17 ENCOUNTER — Ambulatory Visit (INDEPENDENT_AMBULATORY_CARE_PROVIDER_SITE_OTHER): Admitting: Urgent Care

## 2024-07-17 ENCOUNTER — Encounter: Payer: Self-pay | Admitting: Urgent Care

## 2024-07-17 ENCOUNTER — Ambulatory Visit: Payer: Self-pay

## 2024-07-17 VITALS — BP 153/68 | HR 53 | Ht 65.0 in | Wt 161.0 lb

## 2024-07-17 DIAGNOSIS — K4091 Unilateral inguinal hernia, without obstruction or gangrene, recurrent: Secondary | ICD-10-CM | POA: Diagnosis not present

## 2024-07-17 NOTE — Patient Instructions (Signed)
 Please call [facility] at the following number to set up your urgent consultation: [phone]  Please read the attached handout on hernias. Avoid heavy lifting or gym activities.  If you develop severe pain, change in bowel movements or and new symptoms please head to the ER.

## 2024-07-17 NOTE — Telephone Encounter (Signed)
 FYI Only or Action Required?: Action required by provider: request for appointment.  Patient was last seen in primary care on 06/13/2024 by Alvan Dorothyann BIRCH, MD.  Called Nurse Triage reporting Inguinal Hernia.  Symptoms began several days ago.  Interventions attempted: Nothing.  Symptoms are: gradually worsening. Left inguinal hernia. Been there for years, but getting worse. 4 inches across, painful.  Triage Disposition: See Physician Within 24 Hours  Patient/caregiver understands and will follow disposition?: Yes   Copied from CRM #[id number]. Topic: Clinical - Red Word Triage >> Jul 17, 2024  8:38 AM Susanna ORN wrote: Red Word that prompted transfer to Nurse Triage: Patient's wife, Nena, calling in stating that patient is in some pain this morning. States he's having trouble with a hernia near his groin area. Patient states it's still swollen and he noticed it this morning. Reason for Disposition  Can't reduce the hernia  (Note: NO pain, tenderness of hernia, or vomiting)  Answer Assessment - Initial Assessment Questions 1. ONSET:  When did this first appear?     Last week 2. APPEARANCE: What does it look like?     swollen 3. SIZE: How big is it? (e.g., inches, cm; or compare to coins, fruit)     Four fingers 4. LOCATION: Where exactly is the hernia located?     Left groin 5. PATTERN: Does the swelling come and go, or has it been constant since it started?     Comes and goes 6. PAIN: Is there any pain? If Yes, ask: How bad is it?  (Scale 0-10; or none, mild, moderate, severe)     Now - 5 7. DIAGNOSIS: Have you been seen by a doctor (or NP/PA) for this? Did the doctor diagnose you as having a hernia?     yes 8. OTHER SYMPTOMS: Do you have any other symptoms? (e.g., fever, abdomen pain, vomiting)     no 9. PREGNANCY: Is there any chance you are pregnant? When was your last menstrual period?     N/a  Protocols used: Hernia-A-AH

## 2024-07-17 NOTE — Progress Notes (Signed)
 Established Patient Office Visit  Subjective:  Patient ID: Christopher Gregory, male    DOB: September 16, 1956  Age: 68 y.o. MRN: [id number]  Chief Complaint  Patient presents with   Abdominal Pain    left inguinal hernia    Abdominal Pain    Discussed the use of AI scribe software for clinical note transcription with the patient, who gave verbal consent to proceed.  History of Present Illness   Christopher Gregory is a 68 year old male who presents with severe abdominal pain and swelling.  He has a history of hernias for over thirty years, initially noticed during his time in martial arts when he performed extensive sit-ups. He adapted his exercise routine to manage the pain, switching to crunches, which were less painful. 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  Three weeks ago, while pushing a wheelbarrow, a stick stabbed him in the stomach, exacerbating the hernia pain. He experienced significant pain for about two weeks, which then subsided. However, last night, he experienced severe pain, rated at a nine out of ten, upon getting up from a recliner. The pain was localized to the left side, below his belt, with noticeable swelling compared to the right side. This morning, the pain decreased to a five out of ten, but he decided against going to the gym due to discomfort. He has not taken any medication for the pain, which subsided with rest.  On Saturday night, he experienced a burning sensation in his stomach, described as 'on fire,' which was alleviated within thirty minutes after taking ginger, turmeric, and sipping ginger ale. No diarrhea, and no blood in the stool. No fever, chills, vomiting, or urinary symptoms.  He has recently started adding electrolytes to his water after being advised by his son, who is in the medical field, due to a previous episode of dehydration.       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   Hyperuricemia 12/25/2019   CKD stage G3b/A2, GFR 30-44 and albumin creatinine ratio 30-299 mg/g (HCC) 09/21/2019   Pseudogout of left ankle 09/15/2019   Hearing loss in left ear 09/15/2019   IFG (impaired fasting glucose) 05/01/2019   Hypothyroidism 06/29/2018   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   Microscopic polyangiitis (HCC) 02/10/2018   Primary open-angle glaucoma 12/27/2017   Combined form of 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, left eye 12/27/2017   Corneal edema of left eye 12/27/2017   Acquired deviated nasal septum 12/23/2016   Allergic rhinitis 01/03/2016   Primary open angle glaucoma (POAG) of both eyes 01/03/2016   Other long term (current) drug therapy 01/03/2016   HLD (hyperlipidemia) 01/03/2016   Calculus of kidney 01/03/2016   Left lumbar radiculopathy 04/26/2014   Essential hypertension, benign 05/26/2013   Kidney stone 05/26/2013   Avitaminosis D 11/28/2012   Past Medical History:  Diagnosis Date   Allergy    Gout    Hypertension    Thyroid  disease    Past Surgical History:  Procedure Laterality Date   KNEE ARTHROSCOPY     TOTAL HIP ARTHROPLASTY  2000   TOTAL HIP ARTHROPLASTY  2004   Social History   Tobacco Use   Smoking status: Former  Current packs/day: 0.00    Types: Cigarettes    Quit date: 11/26/1973    Years since quitting: 50.6   Smokeless tobacco: Never  Vaping Use   Vaping status: Never Used  Substance Use Topics   Alcohol use: No   Drug use: No      ROS: as noted in HPI  Objective:     BP (!) 153/68   Pulse (!) 53   Ht 5' 5 (1.651 m)   Wt 161 lb (73 kg)   SpO2 98%   BMI 26.79 kg/m  BP Readings from Last 3 Encounters:  07/17/24 (!) 153/68  06/29/24 (!)  144/75  06/13/24 (!) 147/74   Wt Readings from Last 3 Encounters:  07/17/24 161 lb (73 kg)  06/13/24 162 lb (73.5 kg)  05/24/24 158 lb 1.9 oz (71.7 kg)      Physical Exam Vitals and nursing note reviewed. Exam conducted with a chaperone present.  Constitutional:      General: He is not in acute distress.    Appearance: He is well-developed and normal weight. He is not ill-appearing, toxic-appearing or diaphoretic.  HENT:     Head: Normocephalic.     Mouth/Throat:     Mouth: Mucous membranes are moist.  Cardiovascular:     Rate and Rhythm: Normal rate.  Pulmonary:     Effort: Pulmonary effort is normal. No respiratory distress.  Abdominal:     General: Abdomen is flat. Bowel sounds are normal. There is no distension. There are no signs of injury.     Palpations: Abdomen is soft. There is no hepatomegaly or splenomegaly.     Tenderness: There is no abdominal tenderness. There is no right CVA tenderness, left CVA tenderness, guarding or rebound. Negative signs include Murphy's sign.     Hernia: A hernia is present. Hernia is present in the left inguinal area.  Skin:    General: Skin is warm and dry.     Coloration: Skin is not jaundiced.  Neurological:     General: No focal deficit present.     Mental Status: He is alert and oriented to person, place, and time.  Psychiatric:        Mood and Affect: Mood normal.        Behavior: Behavior normal.      No results found for any visits on 07/17/24.  Last CBC Lab Results  Component Value Date   WBC 10.1 12/19/2021   HGB 17.8 (H) 12/19/2021   HCT 53.4 (H) 12/19/2021   MCV 88.4 12/19/2021   MCH 29.5 12/19/2021   RDW 13.1 12/19/2021   PLT 284 12/19/2021   Last metabolic panel Lab Results  Component Value Date   GLUCOSE 139 (H) 05/24/2024   NA 139 05/24/2024   K 4.9 05/24/2024   CL 103 05/24/2024   CO2 20 05/24/2024   BUN 26 05/24/2024   CREATININE 1.42 (H) 05/24/2024   EGFR 53 (L) 05/24/2024   CALCIUM  9.9  05/24/2024   PROT 6.5 05/24/2024   ALBUMIN 4.1 05/24/2024   LABGLOB 2.4 05/24/2024   BILITOT 1.3 (H) 05/24/2024   ALKPHOS 68 05/24/2024   AST 20 05/24/2024   ALT 16 05/24/2024      The 10-year ASCVD risk score (Arnett DK, et al., 2019) is: 27.7%  Assessment & Plan:  Unilateral recurrent inguinal hernia without obstruction or gangrene -     Ambulatory referral to General Surgery  Assessment and Plan    Left inguinal hernia Chronic left  inguinal hernia with increased pain severity, likely hernia-related due to location and nature of pain. Informed about potential complications. - Urgent referral placed to general surgery - pt informed to avoid lifting or going to the gym until after consultation - ER precautions reviewed with pt        No follow-ups on file.   Benton LITTIE Gave, PA

## 2024-07-17 NOTE — Telephone Encounter (Signed)
 Appt today with Christopher Gregory

## 2024-07-18 ENCOUNTER — Telehealth: Payer: Self-pay | Admitting: Family Medicine

## 2024-07-18 ENCOUNTER — Ambulatory Visit: Admitting: Family Medicine

## 2024-07-18 NOTE — Telephone Encounter (Signed)
 Copied from CRM [id number]. Topic: Referral - Question >> Jul 18, 2024  9:59 AM Zane F wrote: Reason for CRM:   Referral : [id number] Creation Date: 07/17/2024  Patient's wife, Nena, is calling to have the above referral sent to a general surgeon in [facility] instead of in [place]. Patient's wife also noted the patient under no circumstance would like to be referred to [facility] due to a previous family incident. Please resubmit referral to a provider in [facility] to assist  patient in hernia evaluation. Please call patient's wife using number listed below once the referral has been sent.  Callback Number: [id number]

## 2024-07-20 ENCOUNTER — Ambulatory Visit

## 2024-07-25 DIAGNOSIS — K4090 Unilateral inguinal hernia, without obstruction or gangrene, not specified as recurrent: Secondary | ICD-10-CM | POA: Diagnosis not present

## 2024-07-25 DIAGNOSIS — Z01818 Encounter for other preprocedural examination: Secondary | ICD-10-CM | POA: Diagnosis not present

## 2024-08-01 DIAGNOSIS — K4090 Unilateral inguinal hernia, without obstruction or gangrene, not specified as recurrent: Secondary | ICD-10-CM | POA: Diagnosis not present

## 2024-08-01 DIAGNOSIS — K4020 Bilateral inguinal hernia, without obstruction or gangrene, not specified as recurrent: Secondary | ICD-10-CM | POA: Diagnosis not present

## 2024-08-01 HISTORY — PX: ROBOT ASSISTED INGUINAL HERNIA REPAIR: SHX6561

## 2024-08-02 DIAGNOSIS — R001 Bradycardia, unspecified: Secondary | ICD-10-CM | POA: Diagnosis not present

## 2024-08-22 ENCOUNTER — Other Ambulatory Visit: Payer: Self-pay | Admitting: Family Medicine

## 2024-08-22 DIAGNOSIS — I10 Essential (primary) hypertension: Secondary | ICD-10-CM

## 2024-08-22 NOTE — Telephone Encounter (Signed)
 Patient would like medication ordered at 10 mg, not 25 mg

## 2024-08-22 NOTE — Telephone Encounter (Unsigned)
 Copied from CRM [id number]. Topic: Clinical - Medication Refill >> Aug 22, 2024 11:02 AM Jasmin G wrote: Medication: hydrALAZINE  (APRESOLINE ) 10 MG tablet, pt states that 25 MG is active but pt wants to go back to 10 MG  Has the patient contacted their pharmacy? Yes (Agent: If no, request that the patient contact the pharmacy for the refill. If patient does not wish to contact the pharmacy document the reason why and proceed with request.) (Agent: If yes, when and what did the pharmacy advise?)  This is the patient's preferred pharmacy: [phone] [address], [place] - [address] [place] [address] Phone: [phone] Fax: [phone]  Is this the correct pharmacy for this prescription? Yes If no, delete pharmacy and type the correct one.   Has the prescription been filled recently? Yes  Is the patient out of the medication? Yes  Has the patient been seen for an appointment in the last year OR does the patient have an upcoming appointment? Yes  Can we respond through MyChart? No  Agent: Please be advised that Rx refills may take up to 3 business days. We ask that you follow-up with your pharmacy.

## 2024-08-23 DIAGNOSIS — Z09 Encounter for follow-up examination after completed treatment for conditions other than malignant neoplasm: Secondary | ICD-10-CM | POA: Diagnosis not present

## 2024-08-23 DIAGNOSIS — Z8719 Personal history of other diseases of the digestive system: Secondary | ICD-10-CM | POA: Diagnosis not present

## 2024-08-23 MED ORDER — HYDRALAZINE HCL 10 MG PO TABS: 10.0000 mg | ORAL_TABLET | Freq: Three times a day (TID) | ORAL | 0 refills | Status: DC

## 2024-09-29 ENCOUNTER — Other Ambulatory Visit: Payer: Self-pay | Admitting: Family Medicine

## 2024-09-29 DIAGNOSIS — I10 Essential (primary) hypertension: Secondary | ICD-10-CM

## 2024-10-18 ENCOUNTER — Other Ambulatory Visit: Payer: Self-pay | Admitting: Family Medicine

## 2024-11-23 ENCOUNTER — Ambulatory Visit (INDEPENDENT_AMBULATORY_CARE_PROVIDER_SITE_OTHER): Admitting: Family Medicine

## 2024-11-23 ENCOUNTER — Encounter: Payer: Self-pay | Admitting: Family Medicine

## 2024-11-23 VITALS — BP 132/74 | HR 51 | Ht 65.0 in | Wt 163.0 lb

## 2024-11-23 DIAGNOSIS — I10 Essential (primary) hypertension: Secondary | ICD-10-CM | POA: Diagnosis not present

## 2024-11-23 DIAGNOSIS — R7301 Impaired fasting glucose: Secondary | ICD-10-CM

## 2024-11-23 DIAGNOSIS — N1832 Chronic kidney disease, stage 3b: Secondary | ICD-10-CM

## 2024-11-23 DIAGNOSIS — E038 Other specified hypothyroidism: Secondary | ICD-10-CM

## 2024-11-23 DIAGNOSIS — M317 Microscopic polyangiitis: Secondary | ICD-10-CM | POA: Diagnosis not present

## 2024-11-23 DIAGNOSIS — F5101 Primary insomnia: Secondary | ICD-10-CM

## 2024-11-23 DIAGNOSIS — E782 Mixed hyperlipidemia: Secondary | ICD-10-CM | POA: Diagnosis not present

## 2024-11-23 LAB — POCT GLYCOSYLATED HEMOGLOBIN (HGB A1C): Hemoglobin A1C: 5.7 % — AB (ref 4.0–5.6)

## 2024-11-23 MED ORDER — HYDRALAZINE HCL 10 MG PO TABS: 10.0000 mg | ORAL_TABLET | Freq: Three times a day (TID) | ORAL | 1 refills | Status: AC

## 2024-11-23 NOTE — Assessment & Plan Note (Signed)
 Impaired fasting glucose A1c improved to 5.7 with dietary changes and increased activity.

## 2024-11-23 NOTE — Progress Notes (Signed)
 "  Established Patient Office Visit  Patient ID: Christopher Gregory, male    DOB: 1956-05-15  Age: 69 y.o. MRN: [id number] PCP: Alvan Dorothyann BIRCH, MD  Chief Complaint  Patient presents with   Hypertension   ifg    Subjective:     HPI  Discussed the use of AI scribe software for clinical note transcription with the patient, who gave verbal consent to proceed.  History of Present Illness Christopher Gregory is a 69 year old male who presents for follow-up after robotic surgery and to discuss cold intolerance and medication side effects.  Postoperative recovery - Underwent robotic groin hernia surgery with successful recovery - Returned to gym activities within three weeks postoperatively - No postoperative pain - Two problematic areas addressed during surgery, with a mild issue on the opposite side also managed  Cold intolerance - Cold intolerance present for several years - Requires thermals even when ambient temperature is 61F - Thyroid  function evaluated prior to surgery; results not yet received  Antihypertensive medication side effects - Hydralazine  25 mg associated with swollen knees and ankles and increased cold sensation - Reduced hydralazine  dose to 10 mg, which was previously prescribed, and tolerating well.  - Plans to continue with 10 mg dose  Blood pressure and heart rate monitoring - Blood pressure readings at the gym typically 123/69 to 124/70 after exercise - Heart rate increased from 50s to 60s, attributed to aging  Sleep disturbance - Intermittent sleep disturbances - Uses melatonin or herbal supplements for sleep support - Reduced melatonin use to twice weekly due to concerns about effects, supplementing with herbal remedies  Dietary and weight changes - Increased dietary intake post-workout with peanut butter and jelly sandwich to support muscle mass, per trainer recommendation - Increase in weight without increase in waist size, suggesting possible muscle  gain  Cardiopulmonary symptoms - No recent chest pain - No breathing problems     ROS    Objective:     BP 132/74   Pulse (!) 51   Ht 5' 5 (1.651 m)   Wt 163 lb (73.9 kg)   SpO2 100%   BMI 27.12 kg/m    Physical Exam Vitals and nursing note reviewed.  Constitutional:      Appearance: Normal appearance.  HENT:     Head: Normocephalic and atraumatic.  Eyes:     Conjunctiva/sclera: Conjunctivae normal.  Cardiovascular:     Rate and Rhythm: Normal rate and regular rhythm.  Pulmonary:     Effort: Pulmonary effort is normal.     Breath sounds: Normal breath sounds.  Skin:    General: Skin is warm and dry.  Neurological:     Mental Status: He is alert.  Psychiatric:        Mood and Affect: Mood normal.      Results for orders placed or performed in visit on 11/23/24  POCT HgB A1C  Result Value Ref Range   Hemoglobin A1C 5.7 (A) 4.0 - 5.6 %   HbA1c POC (<> result, manual entry)     HbA1c, POC (prediabetic range)     HbA1c, POC (controlled diabetic range)        The 10-year ASCVD risk score (Arnett DK, et al., 2019) is: 22%    Assessment & Plan:   Problem List Items Addressed This Visit       Cardiovascular and Mediastinum   Microscopic polyangiitis (HCC)   Was previously followed by nephrology but has not seen them in some time.  Since then we have been keeping pretty close eye on his renal function since they polyangiitis did cause some renal impairment he does take vitamin D  as a supplement      Relevant Medications   hydrALAZINE  (APRESOLINE ) 10 MG tablet   Other Relevant Orders   Urine Microalbumin w/creat. ratio   Essential hypertension, benign   Essential hypertension Blood pressure stable in 123/69 after exercise with heart rate in 60s.      Relevant Medications   hydrALAZINE  (APRESOLINE ) 10 MG tablet   Other Relevant Orders   CMP14+EGFR   Lipid panel   CBC   Urine Microalbumin w/creat. ratio     Endocrine   IFG (impaired fasting  glucose)   Impaired fasting glucose A1c improved to 5.7 with dietary changes and increased activity.       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  He really does a good job and trying to eat healthy and exercise and maintain his BMI      Relevant Orders   Urine Microalbumin w/creat. ratio     Other   HLD (hyperlipidemia) - Primary   For updated lipid panel.      Relevant Medications   hydrALAZINE  (APRESOLINE ) 10 MG tablet    Assessment and Plan Assessment & Plan  Insomnia Variable sleep quality with reliance on melatonin and herbal supplements. - Consider reducing melatonin use and exploring alternative sleep aids. - Avoid caffeine and screen time before bedtime.    Return in about 6 months (around 05/23/2025) for Hypertension, Pre-diabetes.    Dorothyann Byars, MD [facility] at [facility]   "

## 2024-11-23 NOTE — Assessment & Plan Note (Signed)
 Essential hypertension Blood pressure stable in 123/69 after exercise with heart rate in 60s.

## 2024-11-23 NOTE — Assessment & Plan Note (Signed)
For updated lipid panel.

## 2024-11-23 NOTE — Assessment & Plan Note (Signed)
 Continue to monitor renal function regularly.  He really does a good job and trying to eat healthy and exercise and maintain his BMI

## 2024-11-23 NOTE — Assessment & Plan Note (Signed)
 Was previously followed by nephrology but has not seen them in some time.  Since then we have been keeping pretty close eye on his renal function since they polyangiitis did cause some renal impairment he does take vitamin D  as a supplement

## 2024-11-23 NOTE — Assessment & Plan Note (Signed)
 Hypothyroidism - Continue thyroid  medication.\ - He did have a normal TSH about 4 months ago but we will get that updated today as well

## 2024-11-25 LAB — MICROALBUMIN / CREATININE URINE RATIO
Creatinine, Urine: 26.2 mg/dL
Microalb/Creat Ratio: 1847 mg/g{creat} — ABNORMAL HIGH (ref 0–29)
Microalbumin, Urine: 483.9 ug/mL

## 2024-11-25 LAB — CMP14+EGFR
ALT: 15 IU/L (ref 0–44)
AST: 17 IU/L (ref 0–40)
Albumin: 4.6 g/dL (ref 3.9–4.9)
Alkaline Phosphatase: 54 IU/L (ref 47–123)
BUN/Creatinine Ratio: 19 (ref 10–24)
BUN: 37 mg/dL — ABNORMAL HIGH (ref 8–27)
Bilirubin Total: 1.5 mg/dL — ABNORMAL HIGH (ref 0.0–1.2)
CO2: 23 mmol/L (ref 20–29)
Calcium: 10.1 mg/dL (ref 8.6–10.2)
Chloride: 103 mmol/L (ref 96–106)
Creatinine, Ser: 1.99 mg/dL — ABNORMAL HIGH (ref 0.76–1.27)
Globulin, Total: 2.4 g/dL (ref 1.5–4.5)
Glucose: 133 mg/dL — ABNORMAL HIGH (ref 70–99)
Potassium: 4.6 mmol/L (ref 3.5–5.2)
Sodium: 140 mmol/L (ref 134–144)
Total Protein: 7 g/dL (ref 6.0–8.5)
eGFR: 35 mL/min/1.73 — ABNORMAL LOW

## 2024-11-25 LAB — CBC
Hematocrit: 51.3 % — ABNORMAL HIGH (ref 37.5–51.0)
Hemoglobin: 16.7 g/dL (ref 13.0–17.7)
MCH: 30.2 pg (ref 26.6–33.0)
MCHC: 32.6 g/dL (ref 31.5–35.7)
MCV: 93 fL (ref 79–97)
Platelets: 250 x10E3/uL (ref 150–450)
RBC: 5.53 x10E6/uL (ref 4.14–5.80)
RDW: 14.2 % (ref 11.6–15.4)
WBC: 8.1 x10E3/uL (ref 3.4–10.8)

## 2024-11-25 LAB — LIPID PANEL
Chol/HDL Ratio: 4.2 ratio (ref 0.0–5.0)
Cholesterol, Total: 172 mg/dL (ref 100–199)
HDL: 41 mg/dL
LDL Chol Calc (NIH): 98 mg/dL (ref 0–99)
Triglycerides: 189 mg/dL — ABNORMAL HIGH (ref 0–149)
VLDL Cholesterol Cal: 33 mg/dL (ref 5–40)

## 2024-11-25 LAB — TSH: TSH: 2.3 u[IU]/mL (ref 0.450–4.500)

## 2024-11-28 ENCOUNTER — Ambulatory Visit: Payer: Self-pay | Admitting: Family Medicine

## 2024-11-28 DIAGNOSIS — N1832 Chronic kidney disease, stage 3b: Secondary | ICD-10-CM

## 2024-11-28 NOTE — Progress Notes (Signed)
 Okay, his electrolytes are normal so he does not necessarily need electrolytes unless he is just sweating a lot at the gym and even then just maybe 1 drink with electrolytes the rest of it really can just be water.

## 2024-11-28 NOTE — Progress Notes (Signed)
 Hi Riggins, kidney function jumped up slightly to 1.9 but you also look more dry on your labs you looked a little better hydrated 6 months ago.  Because it is a little higher you were 1.4 and now it is 1.9 I do want a recheck again in about 4 months instead of 6 months.  Total cholesterol and LDL looks great.  Triglycerides look better.  Blood count is normal.  And the protein level in the urine has come back down a little bit which is good it still not as good as it was a year ago but it does look better than it did 6 months ago.  Thyroid  levels normal.

## 2025-05-24 ENCOUNTER — Ambulatory Visit: Admitting: Family Medicine
